# Patient Record
Sex: Male | Born: 1960 | Race: White | Hispanic: No | Marital: Married | Smoking: Never smoker
Health system: Southern US, Community
[De-identification: ages and names within clinical notes are randomized; demographics above are authoritative.]

## PROBLEM LIST (undated history)

## (undated) DIAGNOSIS — IMO0001 Reserved for inherently not codable concepts without codable children: Secondary | ICD-10-CM

## (undated) DIAGNOSIS — F419 Anxiety disorder, unspecified: Secondary | ICD-10-CM

## (undated) DIAGNOSIS — M1990 Unspecified osteoarthritis, unspecified site: Secondary | ICD-10-CM

## (undated) DIAGNOSIS — E785 Hyperlipidemia, unspecified: Secondary | ICD-10-CM

## (undated) DIAGNOSIS — I10 Essential (primary) hypertension: Secondary | ICD-10-CM

## (undated) DIAGNOSIS — G4700 Insomnia, unspecified: Secondary | ICD-10-CM

## (undated) HISTORY — PX: BACK SURGERY: SHX140

## (undated) HISTORY — PX: COLONOSCOPY W/ POLYPECTOMY: SHX1380

## (undated) HISTORY — PX: TYMPANOSTOMY TUBE PLACEMENT: SHX32

---

## 1999-09-16 ENCOUNTER — Ambulatory Visit (HOSPITAL_COMMUNITY): Admission: RE | Admit: 1999-09-16 | Discharge: 1999-09-16 | Payer: Self-pay | Admitting: Family Medicine

## 2001-02-01 ENCOUNTER — Encounter: Payer: Self-pay | Admitting: Family Medicine

## 2001-02-01 ENCOUNTER — Ambulatory Visit (HOSPITAL_COMMUNITY): Admission: RE | Admit: 2001-02-01 | Discharge: 2001-02-01 | Payer: Self-pay | Admitting: Family Medicine

## 2001-02-06 ENCOUNTER — Emergency Department (HOSPITAL_COMMUNITY): Admission: EM | Admit: 2001-02-06 | Discharge: 2001-02-06 | Payer: Self-pay

## 2001-02-13 ENCOUNTER — Encounter: Admission: RE | Admit: 2001-02-13 | Discharge: 2001-02-13 | Payer: Self-pay | Admitting: General Practice

## 2001-02-13 ENCOUNTER — Encounter: Payer: Self-pay | Admitting: General Practice

## 2005-07-17 ENCOUNTER — Emergency Department (HOSPITAL_COMMUNITY): Admission: EM | Admit: 2005-07-17 | Discharge: 2005-07-17 | Payer: Self-pay | Admitting: Emergency Medicine

## 2010-02-04 ENCOUNTER — Ambulatory Visit (HOSPITAL_COMMUNITY): Admission: RE | Admit: 2010-02-04 | Discharge: 2010-02-04 | Payer: Self-pay | Admitting: Surgery

## 2010-07-30 LAB — COMPREHENSIVE METABOLIC PANEL
AST: 28 U/L (ref 0–37)
Albumin: 4.6 g/dL (ref 3.5–5.2)
BUN: 15 mg/dL (ref 6–23)
Calcium: 9.8 mg/dL (ref 8.4–10.5)
Chloride: 103 mEq/L (ref 96–112)
Creatinine, Ser: 1.01 mg/dL (ref 0.4–1.5)
GFR calc Af Amer: >60 mL/min (ref >60)
GFR calc non Af Amer: >60 mL/min (ref >60)
Total Bilirubin: 0.3 mg/dL (ref 0.3–1.2)

## 2010-07-30 LAB — CBC
MCH: 30.4 pg (ref 26.0–34.0)
MCHC: 34.4 g/dL (ref 30.0–36.0)
MCV: 88.3 fL (ref 78.0–100.0)
Platelets: 206 10*3/uL (ref 150–400)
RDW: 12.8 % (ref 11.5–15.5)

## 2010-07-30 LAB — DIFFERENTIAL
Basophils Absolute: 0 10*3/uL (ref 0.0–0.1)
Eosinophils Relative: 2 % (ref 0–5)
Lymphocytes Relative: 30 % (ref 12–46)
Lymphs Abs: 2.3 10*3/uL (ref 0.7–4.0)
Monocytes Absolute: 0.7 10*3/uL (ref 0.1–1.0)
Neutro Abs: 4.6 10*3/uL (ref 1.7–7.7)

## 2010-07-30 LAB — SURGICAL PCR SCREEN: Staphylococcus aureus: NEGATIVE

## 2011-08-21 IMAGING — CR DG CHEST 2V
2 series · 2 of 2 positions shown · non-contrast
Comparison: None.

CLINICAL DATA: Umbilical hernia

CHEST - 2 VIEW

[w chest pa]
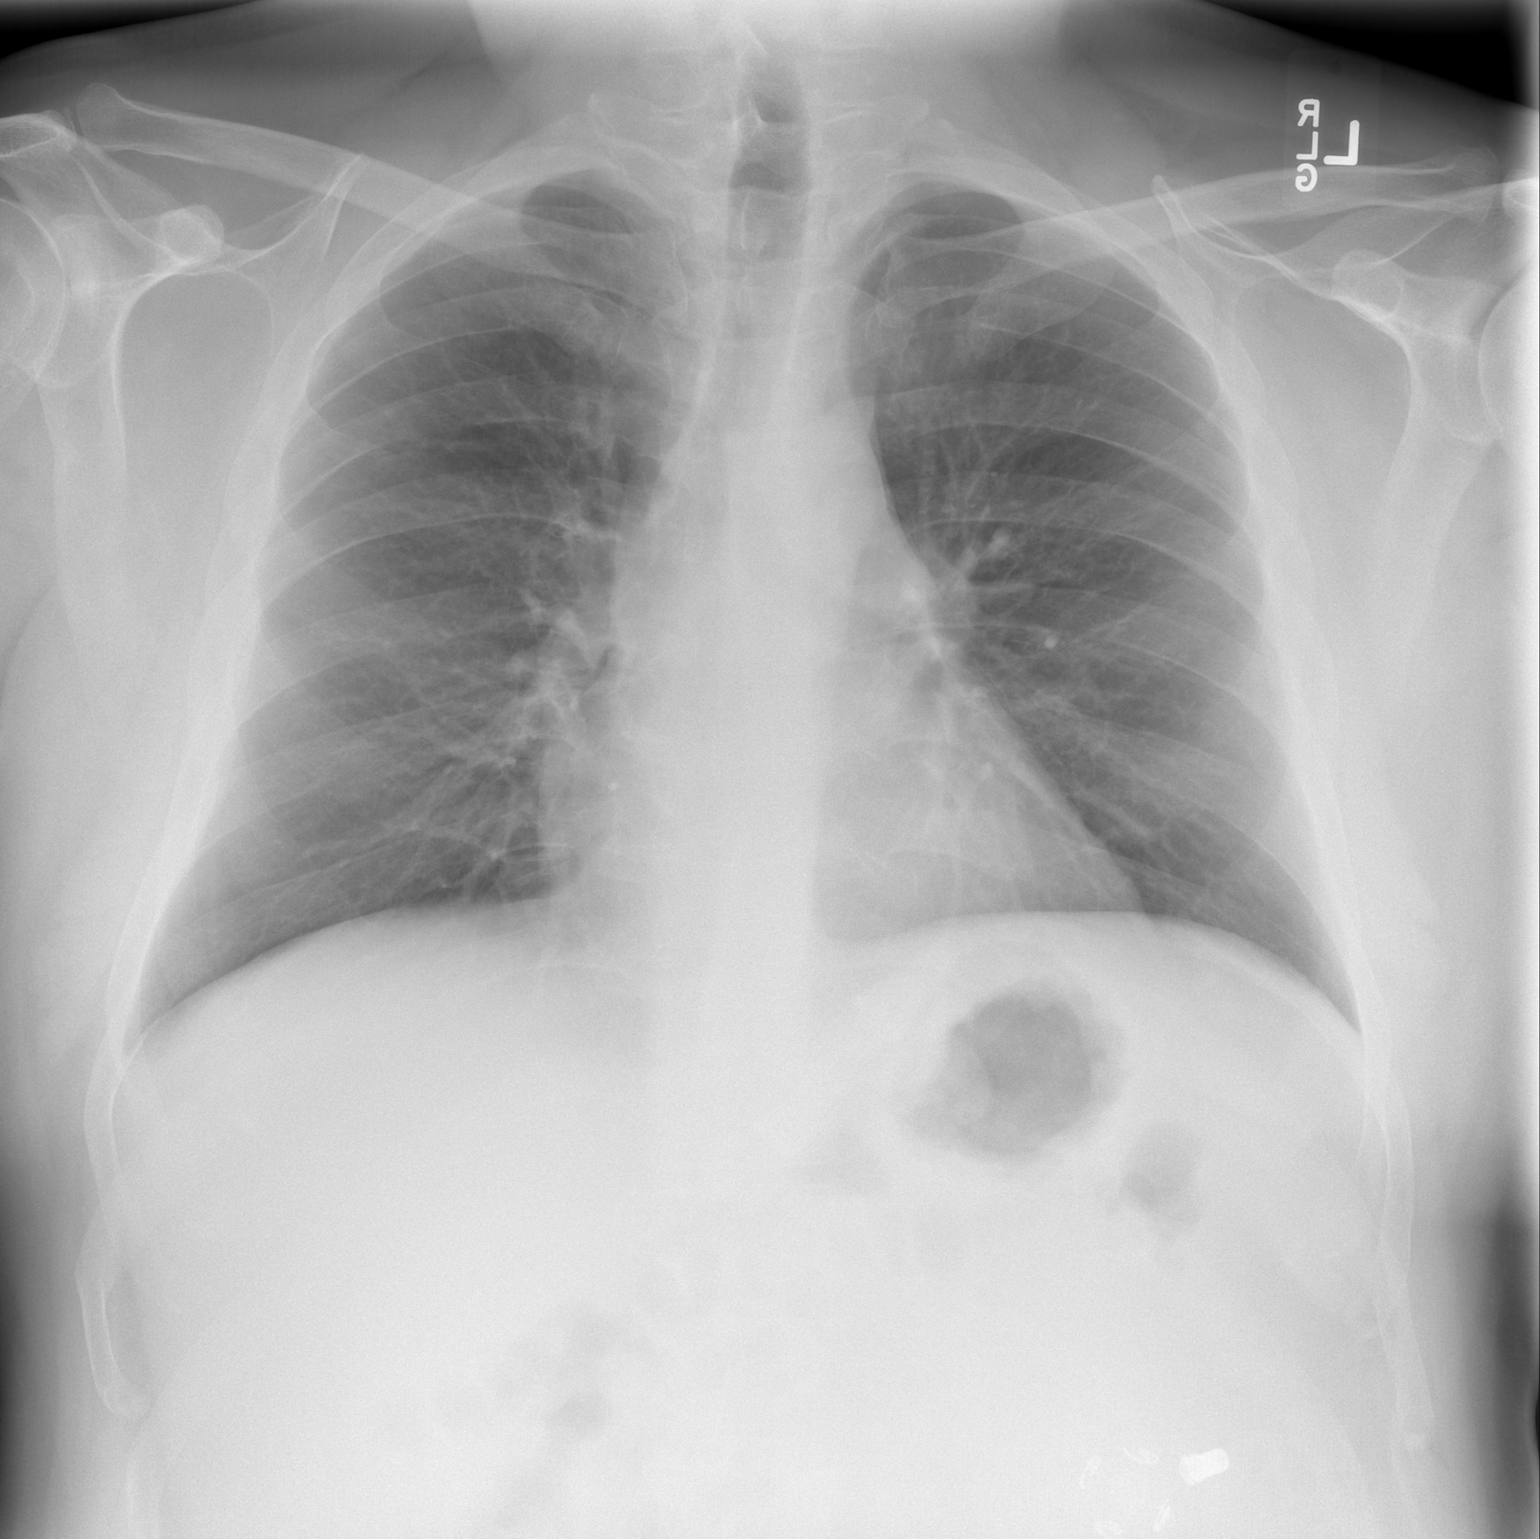

[w chest lat]
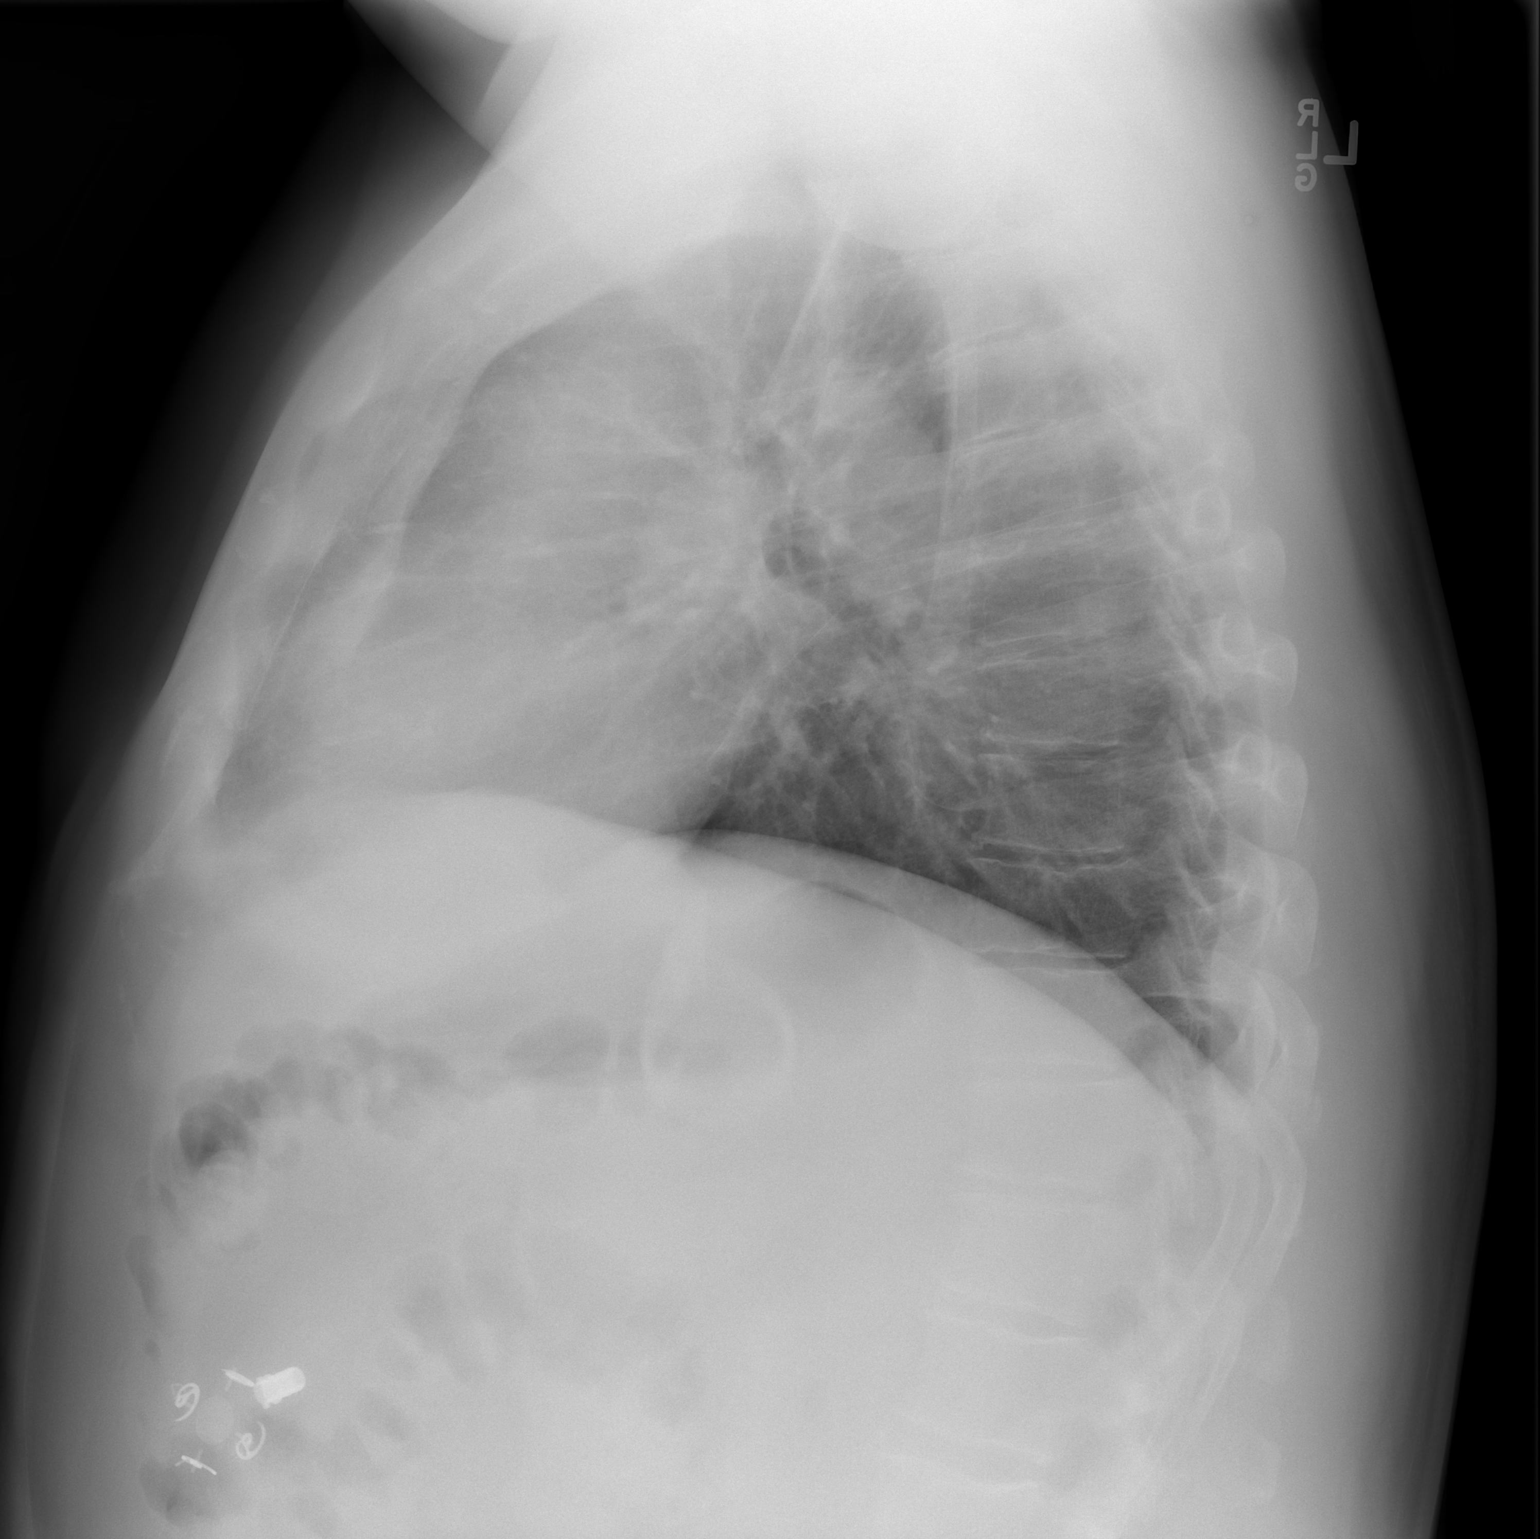

[2 of 2 positions shown; findings below may reference images not displayed]

FINDINGS: The lungs are clear bilaterally.  No confluent airspace
opacities, pleural effuions or pneumothoracies are seen.  The heart
is normal in size and contour.  The upper abdomen and osseous
structures are normal. A radiopaque foreign body projects in the
left upper abdomen of uncertain etiology.
IMPRESSION: No acute cardiopulmonary disease.  Radiopaque foreign body in the
left upper quadrant of the abdomen.  This could possibly be outside
the patient and correlation is recommended.

## 2013-05-08 ENCOUNTER — Ambulatory Visit
Admission: RE | Admit: 2013-05-08 | Discharge: 2013-05-08 | Disposition: A | Discharge status: Home / Self Care | Payer: BC Managed Care – PPO | Source: Ambulatory Visit | Attending: Physician Assistant | Admitting: Physician Assistant

## 2013-05-08 ENCOUNTER — Other Ambulatory Visit: Payer: Self-pay | Admitting: Physician Assistant

## 2013-05-08 DIAGNOSIS — M25511 Pain in right shoulder: Secondary | ICD-10-CM

## 2015-05-20 ENCOUNTER — Other Ambulatory Visit: Payer: Self-pay | Admitting: Physician Assistant

## 2015-05-20 DIAGNOSIS — G8929 Other chronic pain: Secondary | ICD-10-CM

## 2015-05-20 DIAGNOSIS — M25511 Pain in right shoulder: Principal | ICD-10-CM

## 2015-05-30 ENCOUNTER — Ambulatory Visit
Admission: RE | Admit: 2015-05-30 | Discharge: 2015-05-30 | Disposition: A | Discharge status: Home / Self Care | Payer: Managed Care, Other (non HMO) | Source: Ambulatory Visit | Attending: Physician Assistant | Admitting: Physician Assistant

## 2015-05-30 DIAGNOSIS — M25511 Pain in right shoulder: Principal | ICD-10-CM

## 2015-05-30 DIAGNOSIS — G8929 Other chronic pain: Secondary | ICD-10-CM

## 2015-07-02 NOTE — H&P (Signed)
  [name] is an 55 y.o. male.    Chief Complaint: right shoulder pain  HPI: Pt is a 55 y.o. male complaining of right shoulder pain for multiple years. Pain had continually increased since the beginning. X-rays in the clinic show end-stage arthritic changes of the right shoulder. Pt has tried various conservative treatments which have failed to alleviate their symptoms, including injections and therapy. Various options are discussed with the patient. Risks, benefits and expectations were discussed with the patient. Patient understand the risks, benefits and expectations and wishes to proceed with surgery.   PCP:  No primary care provider on file.  D/C Plans: Home  PMH: No past medical history on file.  PSH: No past surgical history on file.  Social History:  has no tobacco, alcohol, and drug history on file.  Allergies:  Allergies not on file  Medications: No current facility-administered medications for this encounter.   No current outpatient prescriptions on file.    No results found for this or any previous visit (from the past 48 hour(s)). No results found.  ROS: Pain with rom of the right upper extremity  Physical Exam:  Alert and oriented 55 y.o. male in no acute distress Cranial nerves 2-12 intact Cervical spine: full rom with no tenderness, nv intact distally Chest: active breath sounds bilaterally, no wheeze rhonchi or rales Heart: regular rate and rhythm, no murmur Abd: non tender non distended with active bowel sounds Hip is stable with rom  Right shoulder crepitus with rom Limited rom due to pain and guarding nv intact distally No rashes or edema Strength of ER and IR 4.5/5  Assessment/Plan Assessment: right shoulder end stage osteoarthritis  Plan: Patient will undergo a right total shoulder arthroplasty by Dr. Ranell Patrick at [facility]. Risks benefits and expectations were discussed with the patient. Patient understand risks, benefits and  expectations and wishes to proceed.

## 2015-07-15 ENCOUNTER — Encounter (HOSPITAL_COMMUNITY): Payer: Self-pay

## 2015-07-15 ENCOUNTER — Encounter (HOSPITAL_COMMUNITY)
Admission: RE | Admit: 2015-07-15 | Discharge: 2015-07-15 | Disposition: A | Discharge status: Home / Self Care | Payer: Managed Care, Other (non HMO) | Source: Ambulatory Visit | Attending: Orthopedic Surgery | Admitting: Orthopedic Surgery

## 2015-07-15 HISTORY — DX: Essential (primary) hypertension: I10

## 2015-07-15 HISTORY — DX: Hyperlipidemia, unspecified: E78.5

## 2015-07-15 HISTORY — DX: Anxiety disorder, unspecified: F41.9

## 2015-07-15 HISTORY — DX: Reserved for inherently not codable concepts without codable children: IMO0001

## 2015-07-15 HISTORY — DX: Insomnia, unspecified: G47.00

## 2015-07-15 HISTORY — DX: Unspecified osteoarthritis, unspecified site: M19.90

## 2015-07-15 LAB — BASIC METABOLIC PANEL
Anion gap: 13 (ref 5–15)
BUN: 18 mg/dL (ref 6–20)
CHLORIDE: 108 mmol/L (ref 101–111)
CO2: 24 mmol/L (ref 22–32)
Calcium: 9.7 mg/dL (ref 8.9–10.3)
Creatinine, Ser: 1.1 mg/dL (ref 0.61–1.24)
GFR calc non Af Amer: >60 mL/min (ref >60)
Glucose, Bld: 70 mg/dL (ref 65–99)
POTASSIUM: 4.3 mmol/L (ref 3.5–5.1)
SODIUM: 145 mmol/L (ref 135–145)

## 2015-07-15 LAB — CBC
HEMATOCRIT: 40.7 % (ref 39.0–52.0)
Hemoglobin: 13.3 g/dL (ref 13.0–17.0)
MCH: 30.4 pg (ref 26.0–34.0)
MCHC: 32.7 g/dL (ref 30.0–36.0)
MCV: 93.1 fL (ref 78.0–100.0)
Platelets: 208 10*3/uL (ref 150–400)
RBC: 4.37 MIL/uL (ref 4.22–5.81)
RDW: 12.5 % (ref 11.5–15.5)
WBC: 8.1 10*3/uL (ref 4.0–10.5)

## 2015-07-15 LAB — SURGICAL PCR SCREEN
MRSA, PCR: NEGATIVE
Staphylococcus aureus: NEGATIVE

## 2015-07-15 NOTE — Progress Notes (Signed)
[name] PCP is [name], [place] at [facility], [place].  Patient denies having a stress test, EKG or cardiac cath.  Last EKG, was prior to back surgery.  [name] denies chest pain or shortness of breath- except with exertion.

## 2015-07-15 NOTE — Progress Notes (Signed)
Cardiologist  Medical Md  Echo  Stress test  Heart cath

## 2015-07-15 NOTE — Pre-Procedure Instructions (Addendum)
[name]  07/15/2015     No Pharmacies Listed   Your procedure is scheduled on Fri, Mar 10 @ 1:10 PM  Report to [facility] Admitting at 11:10 AM  Call this number if you have problems the morning of surgery:  [phone]   Remember:  Do not eat food or drink liquids after midnight.  Take these medicines the morning of surgery with A SIP OF WATER Buspar(Buspirone),Prozac(Fluoxetine),and Flonase(Fluticasone).              May take Tylenol Arthritis, Benadryl if needed.             No Goody's,BC's,Aleve,Aspirin,Ibuprofen,Advil,Motrin,Fish Oil,or any Herbal Medications.    Do not wear jewelry.  Do not wear lotions, powders, or colognes.  You may wear deodorant.  Men may shave face and neck.  Do not bring valuables to the hospital.  [facility] is not responsible for any belongings or valuables.  Contacts, dentures or bridgework may not be worn into surgery.  Leave your suitcase in the car.  After surgery it may be brought to your room.  For patients admitted to the hospital, discharge time will be determined by your treatment team.  Patients discharged the day of surgery will not be allowed to drive home.    Special instructions:  [place] - Preparing for Surgery  Before surgery, you can play an important role.  Because skin is not sterile, your skin needs to be as free of germs as possible.  You can reduce the number of germs on you skin by washing with CHG (chlorahexidine gluconate) soap before surgery.  CHG is an antiseptic cleaner which kills germs and bonds with the skin to continue killing germs even after washing.  Please DO NOT use if you have an allergy to CHG or antibacterial soaps.  If your skin becomes reddened/irritated stop using the CHG and inform your nurse when you arrive at Short Stay.  Do not shave (including legs and underarms) for at least 48 hours prior to the first CHG shower.  You may shave your face.  Please follow these  instructions carefully:   1.  Shower with CHG Soap the night before surgery and the                                morning of Surgery.  2.  If you choose to wash your hair, wash your hair first as usual with your       normal shampoo.  3.  After you shampoo, rinse your hair and body thoroughly to remove the                      Shampoo.  4.  Use CHG as you would any other liquid soap.  You can apply chg directly       to the skin and wash gently with scrungie or a clean washcloth.  5.  Apply the CHG Soap to your body ONLY FROM THE NECK DOWN.        Do not use on open wounds or open sores.  Avoid contact with your eyes,       ears, mouth and genitals (private parts).  Wash genitals (private parts)       with your normal soap.  6.  Wash thoroughly, paying special attention to the area where your surgery will be performed.  7.  Thoroughly  rinse your body with warm water from the neck down.  8.  DO NOT shower/wash with your normal soap after using and rinsing off the CHG Soap.  9.  Pat yourself dry with a clean towel.            10.  Wear clean pajamas.            11.  Place clean sheets on your bed the night of your first shower and do not        sleep with pets.  Day of Surgery  Do not apply any lotions/deoderants the morning of surgery.  Please wear clean clothes to the hospital/surgery center.    Please read over the following fact sheets that you were given. Pain Booklet, Coughing and Deep Breathing, MRSA Information and Surgical Site Infection Prevention

## 2015-07-16 NOTE — Progress Notes (Addendum)
Anesthesia Chart Review: Patient is a 55 year old male scheduled for right total shoulder arthroplasty on 07/18/15.  History includes nonsmoker, hypertension, hyperlipidemia, insomnia, exertional dyspnea, anxiety, arthritis, back surgery. PCP is not listed, but he has several office visits with Georgette Shell, PA-C with [facility] (Care Everywhere).    Meds include Lipitor, BuSpar, Sominex, Prozac, Flonase, Zestoretic, Ambien CR, aspirin-caffeine.  PAT Vitals: BP 124/82 , HR 104 (76 bpm on EKG), RR 20, O2 100%, T 39.6C.  07/15/15 EKG:  Normal sinus rhythm, left axis deviation, early transition. The interpreting cardiologist did not feel that it was significantly changed since her last tracing in [place] (01/30/10). I think EKG also appears stable when compared to 09/20/12 tracing from [place].  07/03/15 CXR ([facility], Care Everywhere): FINDINGS: Lungs are clear. No interstitial or air space opacities. No pneumothorax or pleural effusion. Heart is not enlarged. Bones are normal for age. IMPRESSION: Normal chest radiograph for age. No pneumonia.  Preoperative labs noted.  If no acute changes then I anticipate that he can proceed as planned.  Velna Ochs [facility] Short Stay Center/Anesthesiology Phone [phone] 07/16/2015 6:49 PM

## 2015-07-17 MED ORDER — CEFAZOLIN SODIUM-DEXTROSE 2-3 GM-% IV SOLR
2.0000 g | INTRAVENOUS | Status: AC
  Administered 2015-07-18: 2 g via INTRAVENOUS
  Filled 2015-07-17: qty 50

## 2015-07-17 MED ORDER — CHLORHEXIDINE GLUCONATE 4 % EX LIQD: 60.0000 mL | Freq: Once | CUTANEOUS | Status: DC

## 2015-07-17 NOTE — Progress Notes (Signed)
Nurse instructed patient to arrive at 0830 instead of 1110. Patient verbalized understanding.

## 2015-07-18 ENCOUNTER — Inpatient Hospital Stay (HOSPITAL_COMMUNITY): Payer: Managed Care, Other (non HMO)

## 2015-07-18 ENCOUNTER — Encounter (HOSPITAL_COMMUNITY): Payer: Self-pay | Admitting: *Deleted

## 2015-07-18 ENCOUNTER — Inpatient Hospital Stay (HOSPITAL_COMMUNITY)
Admission: RE | Admit: 2015-07-18 | Discharge: 2015-07-20 | DRG: 483 | Disposition: A | Discharge status: Home / Self Care | Payer: Managed Care, Other (non HMO) | Source: Ambulatory Visit | Attending: Orthopedic Surgery | Admitting: Orthopedic Surgery

## 2015-07-18 ENCOUNTER — Inpatient Hospital Stay (HOSPITAL_COMMUNITY): Payer: Managed Care, Other (non HMO) | Admitting: Vascular Surgery

## 2015-07-18 ENCOUNTER — Inpatient Hospital Stay (HOSPITAL_COMMUNITY): Payer: Managed Care, Other (non HMO) | Admitting: Certified Registered Nurse Anesthetist

## 2015-07-18 ENCOUNTER — Encounter (HOSPITAL_COMMUNITY)
Admission: RE | Disposition: A | Discharge status: Home / Self Care | Payer: Self-pay | Source: Ambulatory Visit | Attending: Orthopedic Surgery

## 2015-07-18 DIAGNOSIS — Z96611 Presence of right artificial shoulder joint: Secondary | ICD-10-CM

## 2015-07-18 DIAGNOSIS — M25711 Osteophyte, right shoulder: Secondary | ICD-10-CM | POA: Diagnosis present

## 2015-07-18 DIAGNOSIS — Z96619 Presence of unspecified artificial shoulder joint: Secondary | ICD-10-CM

## 2015-07-18 DIAGNOSIS — E785 Hyperlipidemia, unspecified: Secondary | ICD-10-CM | POA: Diagnosis present

## 2015-07-18 DIAGNOSIS — I10 Essential (primary) hypertension: Secondary | ICD-10-CM | POA: Diagnosis present

## 2015-07-18 DIAGNOSIS — G4700 Insomnia, unspecified: Secondary | ICD-10-CM | POA: Diagnosis present

## 2015-07-18 DIAGNOSIS — M19011 Primary osteoarthritis, right shoulder: Principal | ICD-10-CM | POA: Diagnosis present

## 2015-07-18 DIAGNOSIS — F419 Anxiety disorder, unspecified: Secondary | ICD-10-CM | POA: Diagnosis present

## 2015-07-18 HISTORY — PX: TOTAL SHOULDER ARTHROPLASTY: SHX126

## 2015-07-18 SURGERY — ARTHROPLASTY, SHOULDER, TOTAL
Anesthesia: Regional | Site: Shoulder | Laterality: Right

## 2015-07-18 MED ORDER — BUPIVACAINE-EPINEPHRINE (PF) 0.25% -1:200000 IJ SOLN
INTRAMUSCULAR | Status: AC
  Filled 2015-07-18: qty 30

## 2015-07-18 MED ORDER — THROMBIN 5000 UNITS EX SOLR
CUTANEOUS | Status: AC
Start: 2015-07-18 — End: 2015-07-18
  Filled 2015-07-18: qty 5000

## 2015-07-18 MED ORDER — PROPOFOL 10 MG/ML IV BOLUS
INTRAVENOUS | Status: AC
  Filled 2015-07-18: qty 20

## 2015-07-18 MED ORDER — DOCUSATE SODIUM 100 MG PO CAPS
100.0000 mg | ORAL_CAPSULE | Freq: Two times a day (BID) | ORAL | Status: DC
  Administered 2015-07-18 – 2015-07-20 (×4): 100 mg via ORAL
  Filled 2015-07-18 (×4): qty 1

## 2015-07-18 MED ORDER — ACETAMINOPHEN 650 MG RE SUPP: 650.0000 mg | Freq: Four times a day (QID) | RECTAL | Status: DC | PRN

## 2015-07-18 MED ORDER — ROCURONIUM BROMIDE 50 MG/5ML IV SOLN
INTRAVENOUS | Status: AC
  Filled 2015-07-18: qty 1

## 2015-07-18 MED ORDER — BUPIVACAINE-EPINEPHRINE 0.25% -1:200000 IJ SOLN
INTRAMUSCULAR | Status: DC | PRN
  Administered 2015-07-18: 10 mL

## 2015-07-18 MED ORDER — SODIUM CHLORIDE 0.9 % IR SOLN
Status: DC | PRN
  Administered 2015-07-18: 1000 mL

## 2015-07-18 MED ORDER — HYDROMORPHONE HCL 1 MG/ML IJ SOLN
INTRAMUSCULAR | Status: AC
  Filled 2015-07-18: qty 1

## 2015-07-18 MED ORDER — LIDOCAINE HCL (CARDIAC) 20 MG/ML IV SOLN
INTRAVENOUS | Status: DC | PRN
  Administered 2015-07-18: 20 mg via INTRAVENOUS

## 2015-07-18 MED ORDER — DEXTROSE 5 % IV SOLN
10.0000 mg | INTRAVENOUS | Status: DC | PRN
  Administered 2015-07-18: 40 ug/min via INTRAVENOUS

## 2015-07-18 MED ORDER — FENTANYL CITRATE (PF) 100 MCG/2ML IJ SOLN
INTRAMUSCULAR | Status: AC
  Administered 2015-07-18: 50 ug
  Filled 2015-07-18: qty 2

## 2015-07-18 MED ORDER — ONDANSETRON HCL 4 MG/2ML IJ SOLN: 4.0000 mg | Freq: Four times a day (QID) | INTRAMUSCULAR | Status: DC | PRN

## 2015-07-18 MED ORDER — POLYETHYLENE GLYCOL 3350 17 G PO PACK
17.0000 g | PACK | Freq: Every day | ORAL | Status: DC | PRN
Start: 2015-07-18 — End: 2015-07-20

## 2015-07-18 MED ORDER — MIDAZOLAM HCL 2 MG/2ML IJ SOLN
INTRAMUSCULAR | Status: AC
  Administered 2015-07-18: 2 mg
  Filled 2015-07-18: qty 2

## 2015-07-18 MED ORDER — PHENOL 1.4 % MT LIQD: 1.0000 | OROMUCOSAL | Status: DC | PRN

## 2015-07-18 MED ORDER — BUSPIRONE HCL 15 MG PO TABS
7.5000 mg | ORAL_TABLET | Freq: Two times a day (BID) | ORAL | Status: DC | PRN
  Filled 2015-07-18: qty 1

## 2015-07-18 MED ORDER — ZOLPIDEM TARTRATE 5 MG PO TABS
5.0000 mg | ORAL_TABLET | Freq: Every evening | ORAL | Status: DC | PRN
Start: 2015-07-18 — End: 2015-07-20

## 2015-07-18 MED ORDER — ROCURONIUM BROMIDE 100 MG/10ML IV SOLN
INTRAVENOUS | Status: DC | PRN
  Administered 2015-07-18: 40 mg via INTRAVENOUS

## 2015-07-18 MED ORDER — CEFAZOLIN SODIUM-DEXTROSE 2-3 GM-% IV SOLR
2.0000 g | Freq: Four times a day (QID) | INTRAVENOUS | Status: AC
  Administered 2015-07-18 – 2015-07-19 (×3): 2 g via INTRAVENOUS
  Filled 2015-07-18 (×3): qty 50

## 2015-07-18 MED ORDER — HYDROCODONE-ACETAMINOPHEN 7.5-325 MG PO TABS: 1.0000 | ORAL_TABLET | Freq: Four times a day (QID) | ORAL | Status: DC | PRN

## 2015-07-18 MED ORDER — BUPIVACAINE-EPINEPHRINE (PF) 0.5% -1:200000 IJ SOLN
INTRAMUSCULAR | Status: DC | PRN
  Administered 2015-07-18: 30 mL via PERINEURAL

## 2015-07-18 MED ORDER — METHOCARBAMOL 500 MG PO TABS
ORAL_TABLET | ORAL | Status: AC
  Filled 2015-07-18: qty 1

## 2015-07-18 MED ORDER — FENTANYL CITRATE (PF) 100 MCG/2ML IJ SOLN
INTRAMUSCULAR | Status: DC | PRN
  Administered 2015-07-18: 250 ug via INTRAVENOUS

## 2015-07-18 MED ORDER — FLUOXETINE HCL 20 MG PO CAPS
20.0000 mg | ORAL_CAPSULE | Freq: Every day | ORAL | Status: DC
  Administered 2015-07-18: 20 mg via ORAL
  Filled 2015-07-18 (×3): qty 1

## 2015-07-18 MED ORDER — LISINOPRIL-HYDROCHLOROTHIAZIDE 10-12.5 MG PO TABS: 1.0000 | ORAL_TABLET | Freq: Every day | ORAL | Status: DC

## 2015-07-18 MED ORDER — PROMETHAZINE HCL 25 MG/ML IJ SOLN: 6.2500 mg | INTRAMUSCULAR | Status: DC | PRN

## 2015-07-18 MED ORDER — LIDOCAINE HCL (CARDIAC) 20 MG/ML IV SOLN
INTRAVENOUS | Status: AC
  Filled 2015-07-18: qty 5

## 2015-07-18 MED ORDER — ONDANSETRON HCL 4 MG/2ML IJ SOLN
INTRAMUSCULAR | Status: AC
  Filled 2015-07-18: qty 2

## 2015-07-18 MED ORDER — SODIUM CHLORIDE 0.9 % IV SOLN
INTRAVENOUS | Status: DC
  Administered 2015-07-19: 05:00:00 via INTRAVENOUS

## 2015-07-18 MED ORDER — ONDANSETRON HCL 4 MG PO TABS: 4.0000 mg | ORAL_TABLET | Freq: Four times a day (QID) | ORAL | Status: DC | PRN

## 2015-07-18 MED ORDER — ONDANSETRON HCL 4 MG/2ML IJ SOLN
INTRAMUSCULAR | Status: DC | PRN
  Administered 2015-07-18: 4 mg via INTRAVENOUS

## 2015-07-18 MED ORDER — HYDROCODONE-ACETAMINOPHEN 7.5-325 MG PO TABS
ORAL_TABLET | ORAL | Status: AC
  Filled 2015-07-18: qty 2

## 2015-07-18 MED ORDER — MIDAZOLAM HCL 2 MG/2ML IJ SOLN: 0.5000 mg | Freq: Once | INTRAMUSCULAR | Status: DC | PRN

## 2015-07-18 MED ORDER — ATORVASTATIN CALCIUM 10 MG PO TABS
10.0000 mg | ORAL_TABLET | Freq: Every day | ORAL | Status: DC
  Administered 2015-07-18 – 2015-07-19 (×2): 10 mg via ORAL
  Filled 2015-07-18 (×2): qty 1

## 2015-07-18 MED ORDER — THROMBIN 5000 UNITS EX SOLR
CUTANEOUS | Status: DC | PRN
  Administered 2015-07-18: 5000 [IU] via TOPICAL

## 2015-07-18 MED ORDER — METOCLOPRAMIDE HCL 5 MG PO TABS: 5.0000 mg | ORAL_TABLET | Freq: Three times a day (TID) | ORAL | Status: DC | PRN

## 2015-07-18 MED ORDER — ASPIRIN-SALICYLAMIDE-CAFFEINE 742-222-38 MG PO PACK: PACK | Freq: Every day | ORAL | Status: DC | PRN

## 2015-07-18 MED ORDER — LISINOPRIL 10 MG PO TABS
10.0000 mg | ORAL_TABLET | Freq: Every day | ORAL | Status: DC
  Administered 2015-07-19 – 2015-07-20 (×2): 10 mg via ORAL
  Filled 2015-07-18: qty 1

## 2015-07-18 MED ORDER — ACETAMINOPHEN 325 MG PO TABS
650.0000 mg | ORAL_TABLET | Freq: Four times a day (QID) | ORAL | Status: DC | PRN
  Administered 2015-07-19 – 2015-07-20 (×2): 650 mg via ORAL
  Filled 2015-07-18 (×2): qty 2

## 2015-07-18 MED ORDER — FLUTICASONE PROPIONATE 50 MCG/ACT NA SUSP
1.0000 | Freq: Every day | NASAL | Status: DC | PRN
  Filled 2015-07-18: qty 16

## 2015-07-18 MED ORDER — MIDAZOLAM HCL 2 MG/2ML IJ SOLN
INTRAMUSCULAR | Status: AC
  Filled 2015-07-18: qty 2

## 2015-07-18 MED ORDER — LACTATED RINGERS IV SOLN
INTRAVENOUS | Status: DC
  Administered 2015-07-18: 09:00:00 via INTRAVENOUS

## 2015-07-18 MED ORDER — MIDAZOLAM HCL 5 MG/5ML IJ SOLN
INTRAMUSCULAR | Status: DC | PRN
  Administered 2015-07-18: 2 mg via INTRAVENOUS

## 2015-07-18 MED ORDER — MEPERIDINE HCL 25 MG/ML IJ SOLN: 6.2500 mg | INTRAMUSCULAR | Status: DC | PRN

## 2015-07-18 MED ORDER — HYDROCHLOROTHIAZIDE 12.5 MG PO CAPS
12.5000 mg | ORAL_CAPSULE | Freq: Every day | ORAL | Status: DC
  Administered 2015-07-19 – 2015-07-20 (×2): 12.5 mg via ORAL
  Filled 2015-07-18 (×2): qty 1

## 2015-07-18 MED ORDER — HYDROMORPHONE HCL 1 MG/ML IJ SOLN
0.2500 mg | INTRAMUSCULAR | Status: DC | PRN
  Administered 2015-07-18 (×4): 0.5 mg via INTRAVENOUS

## 2015-07-18 MED ORDER — HYDROCODONE-ACETAMINOPHEN 7.5-325 MG PO TABS
1.0000 | ORAL_TABLET | ORAL | Status: DC | PRN
  Administered 2015-07-18 – 2015-07-19 (×5): 2 via ORAL
  Filled 2015-07-18 (×5): qty 2

## 2015-07-18 MED ORDER — FENTANYL CITRATE (PF) 250 MCG/5ML IJ SOLN
INTRAMUSCULAR | Status: AC
  Filled 2015-07-18: qty 5

## 2015-07-18 MED ORDER — ACETAMINOPHEN ER 650 MG PO TBCR: 1300.0000 mg | EXTENDED_RELEASE_TABLET | Freq: Every day | ORAL | Status: DC | PRN

## 2015-07-18 MED ORDER — METOCLOPRAMIDE HCL 5 MG/ML IJ SOLN: 5.0000 mg | Freq: Three times a day (TID) | INTRAMUSCULAR | Status: DC | PRN

## 2015-07-18 MED ORDER — METHOCARBAMOL 1000 MG/10ML IJ SOLN
500.0000 mg | Freq: Four times a day (QID) | INTRAVENOUS | Status: DC | PRN
  Filled 2015-07-18: qty 5

## 2015-07-18 MED ORDER — METHOCARBAMOL 500 MG PO TABS: 500.0000 mg | ORAL_TABLET | Freq: Three times a day (TID) | ORAL | Status: AC | PRN

## 2015-07-18 MED ORDER — MORPHINE SULFATE (PF) 2 MG/ML IV SOLN
2.0000 mg | INTRAVENOUS | Status: DC | PRN
  Administered 2015-07-18 (×2): 2 mg via INTRAVENOUS
  Administered 2015-07-19 – 2015-07-20 (×9): 4 mg via INTRAVENOUS
  Filled 2015-07-18: qty 2
  Filled 2015-07-18: qty 1
  Filled 2015-07-18: qty 2
  Filled 2015-07-18: qty 1
  Filled 2015-07-18 (×7): qty 2

## 2015-07-18 MED ORDER — PROPOFOL 10 MG/ML IV BOLUS
INTRAVENOUS | Status: DC | PRN
  Administered 2015-07-18: 50 mg via INTRAVENOUS
  Administered 2015-07-18: 150 mg via INTRAVENOUS

## 2015-07-18 MED ORDER — LACTATED RINGERS IV SOLN
INTRAVENOUS | Status: DC | PRN
  Administered 2015-07-18 (×2): via INTRAVENOUS

## 2015-07-18 MED ORDER — MENTHOL 3 MG MT LOZG: 1.0000 | LOZENGE | OROMUCOSAL | Status: DC | PRN

## 2015-07-18 MED ORDER — PHENYLEPHRINE 40 MCG/ML (10ML) SYRINGE FOR IV PUSH (FOR BLOOD PRESSURE SUPPORT)
PREFILLED_SYRINGE | INTRAVENOUS | Status: AC
  Filled 2015-07-18: qty 10

## 2015-07-18 MED ORDER — METHOCARBAMOL 500 MG PO TABS
500.0000 mg | ORAL_TABLET | Freq: Four times a day (QID) | ORAL | Status: DC | PRN
  Administered 2015-07-18 – 2015-07-20 (×6): 500 mg via ORAL
  Filled 2015-07-18 (×6): qty 1

## 2015-07-18 SURGICAL SUPPLY — 77 items
BLADE SAW SAG 73X25 THK (BLADE) ×2
BLADE SAW SGTL 73X25 THK (BLADE) ×1 IMPLANT
BUR SURG 4X8 MED (BURR) ×1 IMPLANT
BURR SURG 4MMX8MM MEDIUM (BURR) ×1
BURR SURG 4X8 MED (BURR) ×2
CAPT SHLDR TOTAL 2 ×3 IMPLANT
CEMENT BONE DEPUY (Cement) ×3 IMPLANT
CLOSURE STERI-STRIP 1/2X4 (GAUZE/BANDAGES/DRESSINGS) ×1
CLOSURE WOUND 1/2 X4 (GAUZE/BANDAGES/DRESSINGS)
CLSR STERI-STRIP ANTIMIC 1/2X4 (GAUZE/BANDAGES/DRESSINGS) ×2 IMPLANT
COVER SURGICAL LIGHT HANDLE (MISCELLANEOUS) ×3 IMPLANT
DRAPE IMP U-DRAPE 54X76 (DRAPES) ×3 IMPLANT
DRAPE INCISE IOBAN 66X45 STRL (DRAPES) ×9 IMPLANT
DRAPE U-SHAPE 47X51 STRL (DRAPES) ×3 IMPLANT
DRAPE X-RAY CASS 24X20 (DRAPES) IMPLANT
DRILL BIT 5/64 (BIT) ×3 IMPLANT
DRSG ADAPTIC 3X8 NADH LF (GAUZE/BANDAGES/DRESSINGS) ×3 IMPLANT
DRSG PAD ABDOMINAL 8X10 ST (GAUZE/BANDAGES/DRESSINGS) ×3 IMPLANT
DURAPREP 26ML APPLICATOR (WOUND CARE) ×3 IMPLANT
ELECT BLADE 4.0 EZ CLEAN MEGAD (MISCELLANEOUS) ×3
ELECT NEEDLE TIP 2.8 STRL (NEEDLE) ×3 IMPLANT
ELECT REM PT RETURN 9FT ADLT (ELECTROSURGICAL) ×3
ELECTRODE BLDE 4.0 EZ CLN MEGD (MISCELLANEOUS) ×1 IMPLANT
ELECTRODE REM PT RTRN 9FT ADLT (ELECTROSURGICAL) ×1 IMPLANT
GAUZE SPONGE 4X4 12PLY STRL (GAUZE/BANDAGES/DRESSINGS) IMPLANT
GLOVE BIOGEL PI ORTHO PRO 7.5 (GLOVE) ×2
GLOVE BIOGEL PI ORTHO PRO SZ8 (GLOVE) ×2
GLOVE ORTHO TXT STRL SZ7.5 (GLOVE) ×3 IMPLANT
GLOVE PI ORTHO PRO STRL 7.5 (GLOVE) ×1 IMPLANT
GLOVE PI ORTHO PRO STRL SZ8 (GLOVE) ×1 IMPLANT
GLOVE SURG ORTHO 8.5 STRL (GLOVE) ×6 IMPLANT
GOWN STRL REUS W/ TWL LRG LVL3 (GOWN DISPOSABLE) ×1 IMPLANT
GOWN STRL REUS W/ TWL XL LVL3 (GOWN DISPOSABLE) ×3 IMPLANT
GOWN STRL REUS W/TWL LRG LVL3 (GOWN DISPOSABLE) ×2
GOWN STRL REUS W/TWL XL LVL3 (GOWN DISPOSABLE) ×6
HANDPIECE INTERPULSE COAX TIP (DISPOSABLE)
KIT BASIN OR (CUSTOM PROCEDURE TRAY) ×3 IMPLANT
KIT ROOM TURNOVER OR (KITS) ×3 IMPLANT
MANIFOLD NEPTUNE II (INSTRUMENTS) ×3 IMPLANT
NDL SUT 6 .5 CRC .975X.05 MAYO (NEEDLE) ×1 IMPLANT
NEEDLE 1/2 CIR MAYO (NEEDLE) ×3 IMPLANT
NEEDLE HYPO 25GX1X1/2 BEV (NEEDLE) ×3 IMPLANT
NEEDLE MAYO TAPER (NEEDLE) ×2
NS IRRIG 1000ML POUR BTL (IV SOLUTION) ×3 IMPLANT
PACK SHOULDER (CUSTOM PROCEDURE TRAY) ×3 IMPLANT
PACK UNIVERSAL I (CUSTOM PROCEDURE TRAY) ×3 IMPLANT
PAD ARMBOARD 7.5X6 YLW CONV (MISCELLANEOUS) ×6 IMPLANT
PIN METAGLENE 2.5 (PIN) IMPLANT
SET HNDPC FAN SPRY TIP SCT (DISPOSABLE) IMPLANT
SLING ARM IMMOBILIZER LRG (SOFTGOODS) ×3 IMPLANT
SLING ARM IMMOBILIZER MED (SOFTGOODS) IMPLANT
SMARTMIX MINI TOWER (MISCELLANEOUS) ×3
SPONGE GAUZE 4X4 12PLY STER LF (GAUZE/BANDAGES/DRESSINGS) ×3 IMPLANT
SPONGE LAP 18X18 X RAY DECT (DISPOSABLE) ×3 IMPLANT
SPONGE LAP 4X18 X RAY DECT (DISPOSABLE) ×3 IMPLANT
SPONGE SURGIFOAM ABS GEL SZ50 (HEMOSTASIS) ×3 IMPLANT
STRIP CLOSURE SKIN 1/2X4 (GAUZE/BANDAGES/DRESSINGS) IMPLANT
SUCTION FRAZIER HANDLE 10FR (MISCELLANEOUS) ×2
SUCTION TUBE FRAZIER 10FR DISP (MISCELLANEOUS) ×1 IMPLANT
SUT FIBERWIRE #2 38 T-5 BLUE (SUTURE) ×12
SUT MNCRL AB 4-0 PS2 18 (SUTURE) ×3 IMPLANT
SUT VIC AB 0 CT1 27 (SUTURE) ×2
SUT VIC AB 0 CT1 27XBRD ANBCTR (SUTURE) ×1 IMPLANT
SUT VIC AB 0 CT2 27 (SUTURE) ×3 IMPLANT
SUT VIC AB 1 CT1 27 (SUTURE) ×2
SUT VIC AB 1 CT1 27XBRD ANBCTR (SUTURE) ×1 IMPLANT
SUT VIC AB 2-0 CT1 27 (SUTURE) ×2
SUT VIC AB 2-0 CT1 TAPERPNT 27 (SUTURE) ×1 IMPLANT
SUT VICRYL AB 2 0 TIES (SUTURE) IMPLANT
SUTURE FIBERWR #2 38 T-5 BLUE (SUTURE) ×4 IMPLANT
SYR CONTROL 10ML LL (SYRINGE) ×3 IMPLANT
TOWEL OR 17X24 6PK STRL BLUE (TOWEL DISPOSABLE) ×3 IMPLANT
TOWEL OR 17X26 10 PK STRL BLUE (TOWEL DISPOSABLE) ×3 IMPLANT
TOWER SMARTMIX MINI (MISCELLANEOUS) ×1 IMPLANT
TRAY FOLEY CATH 16FRSI W/METER (SET/KITS/TRAYS/PACK) IMPLANT
WATER STERILE IRR 1000ML POUR (IV SOLUTION) ×3 IMPLANT
YANKAUER SUCT BULB TIP NO VENT (SUCTIONS) IMPLANT

## 2015-07-18 NOTE — Anesthesia Procedure Notes (Addendum)
Anesthesia Regional Block:  Interscalene brachial plexus block  Pre-Anesthetic Checklist: ,, timeout performed, Correct Patient, Correct Site, Correct Laterality, Correct Procedure, Correct Position, site marked, Risks and benefits discussed,  Surgical consent,  Pre-op evaluation,  At surgeon's request and post-op pain management  Laterality: Right and Upper  Prep: chloraprep       Needles:  Injection technique: Single-shot  Needle Type: Echogenic Stimulator Needle     Needle Length: 5cm 5 cm Needle Gauge: 22 and 22 G    Additional Needles:  Procedures: nerve stimulator Interscalene brachial plexus block  Nerve Stimulator or Paresthesia:  Response: forearm twitch, 0.42 mA, 0.1 ms,   Additional Responses:   Narrative:  Start time: 07/18/2015 9:21 AM End time: 07/18/2015 9:28 AM Injection made incrementally with aspirations every 5 mL.  Performed by: Personally  Anesthesiologist: Jean RosenthalJACKSON, CARSWELL  Additional Notes: Pt identified in Holding room.  Monitors applied. Working IV access confirmed. Sterile prep R neck.  #22ga PNS to forearm twitch at 0.4242mA threshold.  30cc 0.5% Bupivacaine with 1:200k epi injected incrementally after negative test dose.  Patient asymptomatic, VSS, no heme aspirated, tolerated well.  Sandford Craze Jackson, MD   Procedure Name: Intubation Date/Time: 07/18/2015 10:16 AM Performed by: Rogelia BogaMUELLER, Ajia Chadderdon P Pre-anesthesia Checklist: Patient identified, Emergency Drugs available, Suction available, Patient being monitored and Timeout performed Patient Re-evaluated:Patient Re-evaluated prior to inductionOxygen Delivery Method: Circle system utilized Preoxygenation: Pre-oxygenation with 100% oxygen Intubation Type: IV induction Ventilation: Mask ventilation without difficulty Laryngoscope Size: Mac and 4 Grade View: Grade II Tube type: Oral Tube size: 7.5 mm Number of attempts: 1 Airway Equipment and Method: Stylet Placement Confirmation: ETT inserted through vocal  cords under direct vision,  positive ETCO2 and breath sounds checked- equal and bilateral Secured at: 23 cm Tube secured with: Tape Dental Injury: Teeth and Oropharynx as per pre-operative assessment

## 2015-07-18 NOTE — Anesthesia Preprocedure Evaluation (Addendum)
Anesthesia Evaluation  Patient identified by MRN, date of birth, ID band Patient awake    Reviewed: Allergy & Precautions, NPO status , Patient's Chart, lab work & pertinent test results  History of Anesthesia Complications Negative for: history of anesthetic complications  Airway Mallampati: II  TM Distance: >3 FB Neck ROM: Full    Dental  (+) Teeth Intact, Dental Advisory Given   Pulmonary shortness of breath and with exertion,    breath sounds clear to auscultation       Cardiovascular hypertension, Pt. on medications (-) angina Rhythm:Regular Rate:Normal     Neuro/Psych Back pain    GI/Hepatic negative GI ROS, Neg liver ROS,   Endo/Other  negative endocrine ROS  Renal/GU negative Renal ROS     Musculoskeletal  (+) Arthritis , Osteoarthritis,    Abdominal   Peds  Hematology negative hematology ROS (+)   Anesthesia Other Findings   Reproductive/Obstetrics                           Anesthesia Physical Anesthesia Plan  ASA: II  Anesthesia Plan: General and Regional   Post-op Pain Management: GA combined w/ Regional for post-op pain   Induction: Intravenous  Airway Management Planned: Oral ETT  Additional Equipment:   Intra-op Plan:   Post-operative Plan: Extubation in OR  Informed Consent: I have reviewed the patients History and Physical, chart, labs and discussed the procedure including the risks, benefits and alternatives for the proposed anesthesia with the patient or authorized representative who has indicated his/her understanding and acceptance.   Dental advisory given  Plan Discussed with: CRNA, Anesthesiologist and Surgeon  Anesthesia Plan Comments: (Plan routine monitors, GETA with interscalene block for post op analgesia)       Anesthesia Quick Evaluation

## 2015-07-18 NOTE — Interval H&P Note (Signed)
History and Physical Interval Note:  07/18/2015 9:46 AM  [name]  has presented today for surgery, with the diagnosis of right shoulder end stage osteoarthritis  The various methods of treatment have been discussed with the patient and family. After consideration of risks, benefits and other options for treatment, the patient has consented to  Procedure(s): RIGHT TOTAL SHOULDER ARTHROPLASTY (Right) as a surgical intervention .  The patient's history has been reviewed, patient examined, no change in status, stable for surgery.  I have reviewed the patient's chart and labs.  Questions were answered to the patient's satisfaction.     Pecola Haxton,Mohmmad R

## 2015-07-18 NOTE — Transfer of Care (Signed)
Immediate Anesthesia Transfer of Care Note  Patient: [name]  Procedure(s) Performed: Procedure(s): RIGHT TOTAL SHOULDER ARTHROPLASTY (Right)  Patient Location: PACU  Anesthesia Type:General and GA combined with regional for post-op pain  Level of Consciousness: awake, alert , oriented and patient cooperative  Airway & Oxygen Therapy: Patient Spontanous Breathing and Patient connected to nasal cannula oxygen  Post-op Assessment: Report given to RN, Post -op Vital signs reviewed and stable and Patient moving all extremities X 4  Post vital signs: Reviewed and stable  Last Vitals:  Filed Vitals:   07/18/15 0940 07/18/15 1303  BP: 134/74 124/82  Pulse: 83 74  Temp:  36.6 C  Resp: 22 20    Complications: No apparent anesthesia complications

## 2015-07-18 NOTE — Discharge Instructions (Signed)
Ice to the shoulder constantly.  Use sling while up and walking around or out of the home.  Ok to remove the sling in the home while seated.  Hug a pillow to rest the arm.  Keep elbow propped forward where you can "see it"  Start the exercises as soon as possible.  Lap Slides, Hug and Hitch hike rotation exercise,  Assisted forward elevation to 90 degrees,  Pendulums  Keep the incision clean and dry and covered for one week, then ok to get wet in the shower.  No forceful use of the right shoulder.  Ok to use for light ADLs.  Call for follow up appointment in two weeks [phone]

## 2015-07-18 NOTE — Brief Op Note (Signed)
07/18/2015  1:09 PM  PATIENT:  [name]  55 y.o. male  PRE-OPERATIVE DIAGNOSIS:  right shoulder end stage osteoarthritis  POST-OPERATIVE DIAGNOSIS:  right shoulder end stage osteoarthritis  PROCEDURE:  Procedure(s): RIGHT TOTAL SHOULDER ARTHROPLASTY (Right) DePuy Global Unite, APG Glenoid  SURGEON:  Surgeon(s) and Role:    * Beverely LowSteve Maryjayne Kleven, MD - Primary  PHYSICIAN ASSISTANT:   ASSISTANTS: Thea Gisthomas B Dixon, PA-C   ANESTHESIA:   regional and general  EBL:  Total I/O In: 1100 [I.V.:1100] Out: 200 [Blood:200]  BLOOD ADMINISTERED:none  DRAINS: none   LOCAL MEDICATIONS USED:  MARCAINE     SPECIMEN:  No Specimen  DISPOSITION OF SPECIMEN:  N/A  COUNTS:  YES  TOURNIQUET:  * No tourniquets in log *  DICTATION: .Other Dictation: Dictation Number [id number]  PLAN OF CARE: Admit to inpatient   PATIENT DISPOSITION:  PACU - hemodynamically stable.   Delay start of Pharmacological VTE agent (>24hrs) due to surgical blood loss or risk of bleeding: not applicable

## 2015-07-18 NOTE — Op Note (Signed)
[name], [name]               ACCOUNT NO.:  [id number]  MEDICAL RECORD NO.:  [id number]  LOCATION:  5N31C                        FACILITY:  [facility]  PHYSICIAN:  Almedia BallsSteven R. Ranell PatrickNorris, M.D. DATE OF BIRTH:  02-21-1961  DATE OF PROCEDURE:  07/18/2015 DATE OF DISCHARGE:                              OPERATIVE REPORT   PREOPERATIVE DIAGNOSIS:  Right shoulder end-stage osteoarthritis.  POSTOPERATIVE DIAGNOSIS:  Right shoulder end-stage osteoarthritis.  PROCEDURE PERFORMED:  Right total shoulder arthroplasty using DePuy Global Unite System of APG glenoid.  ATTENDING SURGEON:  Almedia BallsSteven R. Ranell PatrickNorris, M.D.  ASSISTANT:  Standley Dakinshomas Dixon, PA-C, who scrubbed the entire procedure and was necessary for satisfactory completion of the surgery.  ANESTHESIA:  General anesthesia was used plus interscalene block.  ESTIMATED BLOOD LOSS:  200 mL.  FLUID REPLACED:  1500 mL crystalloid.  INSTRUMENT COUNTS:  Correct.  COMPLICATIONS:  There were no complications.  ANTIBIOTICS:  Perioperative antibiotics were given.  INDICATIONS:  The patient is a [age] year old male, with worsening end- stage arthritis of the right shoulder.  The patient has had progressive pain, despite conservative management.  The patient has severe doubled dip deformity in his glenoid consistent with posterior glenoid wear and consistent with primary osteoarthritis.  The patient has failed all measures of conservative management, desires operative treatment to restore function, relieve pain.  Informed consent was obtained.  DESCRIPTION OF PROCEDURE:  After an adequate level of anesthesia was achieved, the patient was positioned in a modified beach-chair position. Right shoulder correctly identified and sterilely prepped and draped in usual manner.  Time-out called.  We entered the patient's shoulder using standard deltopectoral incision, started the coracoid process, extending down the anterior humerus.  We identified the cephalic vein,  taken laterally with deltoid pectoralis taken medially.  Conjoint tendon identified retracted medially.  Subscapularis was released subperiosteally off the lesser tuberosity and tagged for repair at the end with #2 FiberWire suture.  We then released the inferior capsule off the neck.  There was severe bone-on-bone arthritis with large inferior osteophytes.  We then went ahead and placed a T-handled Crego over the top of the humeral head, adjacent to the rotator cuff insertion site and a large Crego elevator medially.  We then used a head resection guide to mark the humeral head resection and we did this with the elbow at the patient's side and about 15-20 degrees of retroversion.  The patient did have significant posterior glenoid erosion.  We were going to be correcting that version some, but I did not want to overly retrovert the cut on the humeral side.  At this point, we made our head cut, with the oscillating saw.  We removed excess osteophytes inferiorly and posteriorly with a rongeur and an osteotome.  We then irrigated.  We next went ahead and finished preparing the humeral shaft with sequential reaming started at 6 and reaming up to size 14.  We then punched with a 12 and 14 punch in appropriate version, looked like estimated between 15 degrees and 20 degrees of retroversion.  Once that was done, we removed the trial components, subluxed the humerus posteriorly.  Freed up the subscapularis for repair at the end.  Removed  the capsule, which was hypertrophied and inflamed, with care taken towards protecting the axillary nerve.  Once we had 360 degree exposure of the glenoid face, we removed a large posterior inferior osteophyte with an osteotome and drill bits to control the osteotomy and we were able to remove that large spur and get back to the native glenoid.  We found the center point of that, drilled a guide pin, reamed initially with a low-profile reamer then the larger  reamer 48 glenoid APG glenoid.  We then drilled our central peg hole and the peripheral holes for the guide, the whole guide set reference in terms of rotationally off the inferior scapular body.  Once those holes were drilled, we placed our trial 48 APG glenoid in place, it sat perfectly flush.  We then removed the trial components, irrigated and then placed epi soaked Gel-Foam soaked in thrombin around the 3 peripheral holes to dry that.  We vacuum excess cement, and then using a cement gun, was able to pressurize and place cement in the 3 peripheral holes.  This is a DePuy 1 cement, then we impacted the real APG glenoid in place with that fluted PEG not having any cement on it and we held the APG glenoid in place until the cement hardened on the table, approximately 17-18 minutes.  Once that was hard, we checked the stability of the glenoid component, which it was quite stable.  We then went ahead and irrigated thoroughly.  We placed multiple drill holes in the lesser tuberosity #2 FiberWire suture for repair of the subscapularis to bone.  We then used impaction grafting technique to place the press-fit 14, body 14 Global Unite stem in place with appropriate version about 18 degrees or so.  With that impacted in place.  We trailed with a 48 x 18 humeral head component.  We then went with a 21, which provided better stability and a coverage is perfect for the bone exposed there at the proximal humerus, so once we had taken the trial out and selected real 48 x 21 glenoid, and it was eccentric; we doubt that eccentricity superiorly and posteriorly with excellent coverage, bony coverage.  To reduce the shoulder had perfect soft tissue balancing.  We were able displace about 50% inferiorly, 50% posteriorly. We irrigated thoroughly and then did an anatomic subscapularis repair back to the lesser tuberosity, back to bone.  We also repaired the rotator interval.  We were able to get about 45  degrees of external rotation, with repair and no tethering as we ranged that shoulder through a full arc of motion.  Final irrigation and closed deltopectoral interval with interrupted #1 Vicryl suture followed by 0 Vicryl, 2-0 Vicryl, layered subcutaneous closure and 4-0 Monocryl for skin.  Steri- Strips applied followed by sterile dressing.  The patient tolerated the surgery well.     Almedia Balls. Ranell Patrick, M.D.     SRN/MEDQ  D:  07/18/2015  T:  07/18/2015  Job:  [id number]

## 2015-07-19 LAB — BASIC METABOLIC PANEL
Anion gap: 7 (ref 5–15)
BUN: 15 mg/dL (ref 6–20)
CHLORIDE: 103 mmol/L (ref 101–111)
CO2: 25 mmol/L (ref 22–32)
Calcium: 8.7 mg/dL — ABNORMAL LOW (ref 8.9–10.3)
Creatinine, Ser: 0.72 mg/dL (ref 0.61–1.24)
GFR calc Af Amer: >60 mL/min (ref >60)
GLUCOSE: 117 mg/dL — AB (ref 65–99)
POTASSIUM: 3.8 mmol/L (ref 3.5–5.1)
Sodium: 135 mmol/L (ref 135–145)

## 2015-07-19 LAB — HEMOGLOBIN AND HEMATOCRIT, BLOOD
HEMATOCRIT: 30.4 % — AB (ref 39.0–52.0)
Hemoglobin: 9.8 g/dL — ABNORMAL LOW (ref 13.0–17.0)

## 2015-07-19 MED ORDER — OXYCODONE HCL 5 MG PO TABS: 5.0000 mg | ORAL_TABLET | ORAL | Status: AC | PRN

## 2015-07-19 MED ORDER — OXYCODONE HCL 5 MG PO TABS
5.0000 mg | ORAL_TABLET | ORAL | Status: DC | PRN
  Administered 2015-07-19 – 2015-07-20 (×8): 10 mg via ORAL
  Filled 2015-07-19 (×8): qty 2

## 2015-07-19 NOTE — Care Management (Signed)
Utilization review completed. Teja Costen, RN Case Manager [phone]. 

## 2015-07-19 NOTE — Progress Notes (Signed)
OT Cancellation Note  Patient Details Name: [name] MRN: [id number] DOB: [birth year]   Cancelled Treatment:    Reason Eval/Treat Not Completed: Pain limiting ability to participate (9/10). Pt declined therapy this afternoon due to severe pain and pt reported "I don't feel good about going home today. I think tomorrow would be better." Will attempt to see pt tomorrow to perform exercises and dressing tasks.  Nils PyleJulia Denielle Bayard, OTR/L Pager: [phone] 07/19/2015, 2:57 PM

## 2015-07-19 NOTE — Progress Notes (Signed)
Occupational Therapy Evaluation Patient Details Name: [name] MRN: [id number] DOB: 04/27/1961 Today's Date: 07/19/2015    History of Present Illness 55 y.o. male s/p R total shoulder arthroplasty.   Clinical Impression   PTA, pt was independent with ADLs and mobility. Pt currently requires min assist for UB ADLs and min guard assist for mobility. Began education on shoulder precautions, sling wear protocol, positioning for sleep, and compensatory strategies for bathing and dressing. Pt plans to d/c home with 24/7 assistance from his wife until Wednesday. Pt will benefit from continued acute OT to increase independence and safety with ADLs and mobility to allow for safe discharge home.     Follow Up Recommendations  Outpatient OT (Progress shoulder rehab at MD discretion)    Equipment Recommendations  None recommended by OT    Recommendations for Other Services       Precautions / Restrictions Precautions Precautions: Shoulder Type of Shoulder Precautions: Active protocol Shoulder Interventions: Shoulder sling/immobilizer;Off for dressing/bathing/exercises Precaution Booklet Issued: Yes (comment) Precaution Comments: A/PROM shoulder FF 90, ABD 60, ER 30; AROM elbow/wrist/hand Required Braces or Orthoses: Sling Restrictions Weight Bearing Restrictions: Yes RUE Weight Bearing: Non weight bearing      Mobility Bed Mobility Overal bed mobility: Needs Assistance Bed Mobility: Supine to Sit     Supine to sit: Min guard;HOB elevated     General bed mobility comments: HOB elevated, no use of bedrails to simulate home environment. Min guard assist for safety - no physical assist required  Transfers Overall transfer level: Needs assistance Equipment used: None Transfers: Sit to/from Stand Sit to Stand: Supervision         General transfer comment: Supervision for safety. No physical assist required, no overt LOB and reports of minimal dizziness from pain  medication    Balance Overall balance assessment: Needs assistance Sitting-balance support: No upper extremity supported;Feet supported Sitting balance-Leahy Scale: Good     Standing balance support: No upper extremity supported;During functional activity Standing balance-Leahy Scale: Good                              ADL Overall ADL's : Needs assistance/impaired     Grooming: Wash/dry hands;Supervision/safety;Standing                   Toilet Transfer: Supervision/safety;Ambulation   Toileting- Clothing Manipulation and Hygiene: Supervision/safety;Sit to/from stand       Functional mobility during ADLs: Supervision/safety General ADL Comments: Began education on shoulder precautions, sling wear protocol, and compensatory strategies for UB bathing and dressing     Vision Vision Assessment?: No apparent visual deficits   Perception     Praxis      Pertinent Vitals/Pain Pain Assessment: 0-10 Pain Score: 4  Pain Location: R shoulder Pain Descriptors / Indicators: Aching;Sore Pain Intervention(s): Limited activity within patient's tolerance;Monitored during session;Repositioned     Hand Dominance Right   Extremity/Trunk Assessment Upper Extremity Assessment Upper Extremity Assessment: RUE deficits/detail RUE Deficits / Details: decreased ROM and strength as expected post op RUE: Unable to fully assess due to immobilization;Unable to fully assess due to pain   Lower Extremity Assessment Lower Extremity Assessment: Overall WFL for tasks assessed   Cervical / Trunk Assessment Cervical / Trunk Assessment: Normal   Communication Communication Communication: No difficulties   Cognition Arousal/Alertness: Awake/alert Behavior During Therapy: WFL for tasks assessed/performed Overall Cognitive Status: Within Functional Limits for tasks assessed  General Comments       Exercises Exercises: Shoulder     Shoulder  Instructions Shoulder Instructions Donning/doffing shirt without moving shoulder: Minimal assistance Method for sponge bathing under operated UE: Minimal assistance Donning/doffing sling/immobilizer: Minimal assistance Correct positioning of sling/immobilizer: Min-guard Sling wearing schedule (on at all times/off for ADL's): Min-guard Proper positioning of operated UE when showering: Min-guard Positioning of UE while sleeping: Min-guard    Home Living Family/patient expects to be discharged to:: Private residence Living Arrangements: Spouse/significant other Available Help at Discharge: Family;Available 24 hours/day (Until Wednesday) Type of Home: House Home Access: Stairs to enter Entergy CorporationEntrance Stairs-Number of Steps: 3 Entrance Stairs-Rails: None Home Layout: One level     Bathroom Shower/Tub: Walk-in shower;Door   Foot LockerBathroom Toilet: Handicapped height     Home Equipment: Hand [name] shower head;Other (comment) (adjustable bed)          Prior Functioning/Environment Level of Independence: Independent        Comments: Works full-time and highway drives daily for work    OT Diagnosis: Acute pain   OT Problem List: Decreased strength;Decreased range of motion;Decreased activity tolerance;Impaired balance (sitting and/or standing);Decreased coordination;Decreased safety awareness;Decreased knowledge of use of DME or AE;Decreased knowledge of precautions;Pain   OT Treatment/Interventions: Self-care/ADL training;Therapeutic exercise;Energy conservation;DME and/or AE instruction;Therapeutic activities;Patient/family education;Balance training    OT Goals(Current goals can be found in the care plan section) Acute Rehab OT Goals Patient Stated Goal: to go home OT Goal Formulation: With patient Time For Goal Achievement: 08/02/15 Potential to Achieve Goals: Good ADL Goals Pt Will Perform Upper Body Bathing: with min guard assist;with caregiver independent in assisting;sitting Pt  Will Perform Upper Body Dressing: with min guard assist;with caregiver independent in assisting;sitting Pt Will Perform Lower Body Dressing: with min guard assist;with caregiver independent in assisting;sit to/from stand Pt/caregiver will Perform Home Exercise Program: Right Upper extremity;Increased ROM;With written HEP provided;With Supervision  OT Frequency: Min 2X/week   Barriers to D/C:            Co-evaluation              End of Session Equipment Utilized During Treatment: Gait belt;Rolling walker Nurse Communication: Mobility status;Other (comment) (Need 2nd session before discharge)  Activity Tolerance: Patient tolerated treatment well Patient left: in chair;with call bell/phone within reach;with family/visitor present   Time: [id number] OT Time Calculation (min): 20 min Charges:  OT General Charges $OT Visit: 1 Procedure OT Evaluation $OT Eval Moderate Complexity: 1 Procedure G-Codes:    Nils PyleJulia Dalen [place], OTR/L Pager: [phone]: [phone] 07/19/2015, 1:00 PM

## 2015-07-19 NOTE — Progress Notes (Signed)
Subjective: 1 Day Post-Op Procedure(s) (LRB): RIGHT TOTAL SHOULDER ARTHROPLASTY (Right) Patient reports pain as moderate to severe. Norco not controlling pain. Pain is worse this morning than yesterday. No numbness or tingling. No other c/o. Denies CP, SOB, N/V.    Objective: Vital signs in last 24 hours: Temp:  [97.8 F (36.6 C)-98.7 F (37.1 C)] 98.2 F (36.8 C) (03/04 0350) Pulse Rate:  [64-83] 71 (03/04 0350) Resp:  [12-24] 18 (03/04 0350) BP: (102-126)/(63-83) 126/77 mmHg (03/04 0350) SpO2:  [95 %-100 %] 95 % (03/04 0350)  Intake/Output from previous day: 03/03 0701 - 03/04 0700 In: 1920 [P.O.:270; I.V.:1650] Out: 200 [Blood:200] Intake/Output this shift:     Recent Labs  07/19/15 0530  HGB 9.8*    Recent Labs  07/19/15 0530  HCT 30.4*    Recent Labs  07/19/15 0530  NA 135  K 3.8  CL 103  CO2 25  BUN 15  CREATININE 0.72  GLUCOSE 117*  CALCIUM 8.7*   No results for input(s): LABPT, INR in the last 72 hours.  Neurologically intact ABD soft Neurovascular intact Sensation intact distally Intact pulses distally Dorsiflexion/Plantar flexion intact Incision: dressing C/D/I and no drainage No cellulitis present Compartment soft no sign of DVT  Assessment/Plan: 1 Day Post-Op Procedure(s) (LRB): RIGHT TOTAL SHOULDER ARTHROPLASTY (Right) Advance diet Up with therapy D/C IV fluids  Switch from Norco to OxyIR for pain control here and at home Possible D/C later today if pain controlled and tolerates PT well Please page or call my cell at [phone] if pt desires to go home today and will place D/C order otherwise plan to D/C tomorrow  BISSELL, Lockie ParesJACLYN M. 07/19/2015, 11:08 AM

## 2015-07-20 NOTE — Progress Notes (Signed)
Occupational Therapy Treatment/Discharge Patient Details Name: [name] MRN: [id number] DOB: 11/25/1960 Today's Date: 07/20/2015    History of present illness 55 y.o. male s/p R total shoulder arthroplasty.   OT comments  Pt completed RUE exercise program with no physical assist and min instruction cues. Reviewed shoulder precautions and practiced UB dressing tasks and donning/doffing sling. Educated pt on compensatory strategies for ADLs, pain/edema management, energy conservation, gradually increasing activity level, and fall prevention strategies. All education has been completed and pt/wife and have no further questions. Pt with no further acute OT needs. OT signing off.    Follow Up Recommendations  Outpatient OT (Progress shoulder rehab at MD discretion)    Equipment Recommendations  None recommended by OT    Recommendations for Other Services      Precautions / Restrictions Precautions Precautions: Shoulder Type of Shoulder Precautions: Active protocol Shoulder Interventions: Shoulder sling/immobilizer;Off for dressing/bathing/exercises Precaution Booklet Issued: Yes (comment) Precaution Comments: A/PROM shoulder FF 90, ABD 60, ER 30; AROM elbow/wrist/hand Required Braces or Orthoses: Sling Restrictions Weight Bearing Restrictions: Yes RUE Weight Bearing: Non weight bearing       Mobility Bed Mobility               General bed mobility comments: Pt up ambulating in hallway with wife  Transfers Overall transfer level: Modified independent Equipment used: None                  Balance Overall balance assessment: No apparent balance deficits (not formally assessed)                                 ADL Overall ADL's : Needs assistance/impaired         Upper Body Bathing: Minimal assitance;Cueing for UE precautions;Sitting   Lower Body Bathing: Minimal assistance;With caregiver independent assisting;Sit to/from stand   Upper  Body Dressing : Minimal assistance;Sitting;Cueing for compensatory techniques;Cueing for UE precautions Upper Body Dressing Details (indicate cue type and reason): Cues to dress RUE first and undress it last Lower Body Dressing: Minimal assistance;With caregiver independent assisting;Sit to/from stand;Cueing for compensatory techniques Lower Body Dressing Details (indicate cue type and reason): Cues to dress LB first while RUE still in sling for safety Toilet Transfer: Modified Independent;Ambulation;Regular Toilet   Toileting- Clothing Manipulation and Hygiene: Modified independent;Sit to/from stand         General ADL Comments: Completed education on RUE exercise program, donning/doffing sling and clothing, bathing, pain/edema management, energy conservation, and fall prevention strategies. Emphasized importance of gradually increasing activity level for safety.       Vision                     Perception     Praxis      Cognition   Behavior During Therapy: [facility] for tasks assessed/performed Overall Cognitive Status: Within Functional Limits for tasks assessed                       Extremity/Trunk Assessment               Exercises Shoulder Exercises Shoulder Flexion: PROM;AAROM;Right;10 reps;Seated Shoulder ABduction: PROM;AAROM;Right;10 reps;Seated Shoulder External Rotation: PROM;AAROM;Right;10 reps;Seated Elbow Flexion: AROM;Right;10 reps;Seated Elbow Extension: AROM;Right;10 reps;Seated Wrist Flexion: AROM;Right;10 reps;Seated Wrist Extension: AROM;Right;10 reps;Seated Digit Composite Flexion: AROM;Right;10 reps;Seated Composite Extension: AROM;Right;10 reps;Seated Neck Flexion: AROM;Other reps (comment);Seated (as needed) Neck Extension: AROM;Other reps (comment);Seated (as needed) Neck  Lateral Flexion - Right: AROM;Other reps (comment);Seated (as needed) Neck Lateral Flexion - Left: AROM;10 reps;Other reps (comment);Seated (as  needed) Donning/doffing shirt without moving shoulder: Minimal assistance Method for sponge bathing under operated UE: Minimal assistance Donning/doffing sling/immobilizer: Minimal assistance Correct positioning of sling/immobilizer: Independent ROM for elbow, wrist and digits of operated UE: Independent Sling wearing schedule (on at all times/off for ADL's): Independent Proper positioning of operated UE when showering: Independent Positioning of UE while sleeping: Independent   Shoulder Instructions Shoulder Instructions Donning/doffing shirt without moving shoulder: Minimal assistance Method for sponge bathing under operated UE: Minimal assistance Donning/doffing sling/immobilizer: Minimal assistance Correct positioning of sling/immobilizer: Independent ROM for elbow, wrist and digits of operated UE: Independent Sling wearing schedule (on at all times/off for ADL's): Independent Proper positioning of operated UE when showering: Independent Positioning of UE while sleeping: Independent     General Comments      Pertinent Vitals/ Pain       Pain Assessment: 0-10 Pain Score: 4  Pain Location: R shoulder Pain Descriptors / Indicators: Sore Pain Intervention(s): Limited activity within patient's tolerance;Premedicated before session;Monitored during session;Repositioned;Ice applied  Home Living                                          Prior Functioning/Environment              Frequency       Progress Toward Goals  OT Goals(current goals can now be found in the care plan section)  Progress towards OT goals: Goals met/education completed, patient discharged from OT  Acute Rehab OT Goals Patient Stated Goal: to get back to work OT Goal Formulation: With patient Time For Goal Achievement: 08/02/15 Potential to Achieve Goals: Good ADL Goals Pt Will Perform Upper Body Bathing: with min guard assist;with caregiver independent in assisting;sitting Pt  Will Perform Upper Body Dressing: with min guard assist;with caregiver independent in assisting;sitting Pt Will Perform Lower Body Dressing: with min guard assist;with caregiver independent in assisting;sit to/from stand Pt/caregiver will Perform Home Exercise Program: Right Upper extremity;Increased ROM;With written HEP provided;With Supervision  Plan All goals met and education completed, patient discharged from OT services    Co-evaluation                 End of Session Equipment Utilized During Treatment: Other (comment) (sling)   Activity Tolerance Patient tolerated treatment well   Patient Left in chair;with call bell/phone within reach;with family/visitor present   Nurse Communication Mobility status;Other (comment) (pt requesting dressing change before discharge)        Time: 6720-9470 OT Time Calculation (min): 26 min  Charges: OT General Charges $OT Visit: 1 Procedure OT Treatments $Self Care/Home Management : 23-37 mins  Redmond Baseman, OTR/L Pager: [phone] 07/20/2015, 9:06 AM

## 2015-07-20 NOTE — Progress Notes (Signed)
Subjective: 2 Days Post-Op Procedure(s) (LRB): RIGHT TOTAL SHOULDER ARTHROPLASTY (Right) Patient reports pain as well .  Reports he is ready to go home. Denies SOB, CP, or calf pain.  Objective: Vital signs in last 24 hours: Temp:  [98.3 F (36.8 C)-99.3 F (37.4 C)] 98.3 F (36.8 C) (03/05 0630) Pulse Rate:  [79-89] 79 (03/05 0630) Resp:  [16-18] 16 (03/05 0630) BP: (121-132)/(72-82) 121/72 mmHg (03/05 0630) SpO2:  [96 %-97 %] 96 % (03/05 0630)  Intake/Output from previous day: 03/04 0701 - 03/05 0700 In: 360 [P.O.:360] Out: -  Intake/Output this shift:     Recent Labs  07/19/15 0530  HGB 9.8*    Recent Labs  07/19/15 0530  HCT 30.4*    Recent Labs  07/19/15 0530  NA 135  K 3.8  CL 103  CO2 25  BUN 15  CREATININE 0.72  GLUCOSE 117*  CALCIUM 8.7*   No results for input(s): LABPT, INR in the last 72 hours.  Well nourished. Alert and oriented x3. RRR, Lungs clear, BS x4. Abdomen soft and non tender. Right Calf soft and non tender. Right knee dressing C/D/I. No DVT signs. Compartment soft. No signs of infection.  Right UE grossly neurovascular intact.  Assessment/Plan: 2 Days Post-Op Procedure(s) (LRB): RIGHT TOTAL SHOULDER ARTHROPLASTY (Right) D/C home with family Follow instructions F/u with Dr. Ranell PatrickNorris Change dressing Up with PT  Arsenio LoaderSTILWELL, Helio Lack L 07/20/2015, 10:27 AM

## 2015-07-21 ENCOUNTER — Encounter (HOSPITAL_COMMUNITY): Payer: Self-pay | Admitting: Orthopedic Surgery

## 2015-07-23 NOTE — Discharge Summary (Signed)
Physician Discharge Summary  Patient ID: [name] MRN: [id number] DOB/AGE: [birth year] 55 y.o.  Admit date: 07/18/2015 Discharge date: 07/23/2015  Admission Diagnoses: Shoulder OA  Discharge Diagnoses:  Active Problems:   S/P shoulder replacement   Discharged Condition: good  Hospital Course:  [name] is a 55 y.o. who was admitted to [facility]. They were brought to the operating room on 07/18/2015 and underwent Procedure(s): RIGHT TOTAL SHOULDER ARTHROPLASTY.  Patient tolerated the procedure well and was later transferred to the recovery room and then to the orthopaedic floor for postoperative care.  They were given PO and IV analgesics for pain control following their surgery.  They were given 24 hours of postoperative antibiotics of  Anti-infectives    Start     Dose/Rate Route Frequency Ordered Stop   07/18/15 1700  ceFAZolin (ANCEF) IVPB 2 g/50 mL premix     2 g 100 mL/hr over 30 Minutes Intravenous Every 6 hours 07/18/15 1617 07/19/15 0533   07/18/15 0700  ceFAZolin (ANCEF) IVPB 2 g/50 mL premix     2 g 100 mL/hr over 30 Minutes Intravenous To ShortStay Surgical 07/17/15 1103 07/18/15 1018       PT and OT were ordered for total joint protocol.  Discharge planning consulted to help with postop disposition and equipment needs.  Patient had a good night on the evening of surgery and started to get up OOB with therapy on day one.  Continued to work with therapy into day two.  Dressing was with normal limits.  The patient had progressed with therapy and meeting their goals. Patient was seen in rounds and was ready to go home.  Consults: n/a  Significant Diagnostic Studies: routine  Treatments: routine  Discharge Exam: Blood pressure 121/72, pulse 79, temperature 98.3 F (36.8 C), temperature source Oral, resp. rate 16, weight 86.274 kg (190 lb 3.2 oz), SpO2 96 %. Alert and oriented x3. RRR, Lungs clear, BS x4.  Calf soft and non tender. L shoulder dressing  changed. No DVT signs. No signs of infection or compartment syndrome. LUE grossly neurovascularly intact.   Disposition: 01-Home or Self Care     Medication List    STOP taking these medications        diphenhydrAMINE 25 MG tablet  Commonly known as:  SOMINEX      TAKE these medications        acetaminophen 650 MG CR tablet  Commonly known as:  TYLENOL  Take 1,300 mg by mouth daily as needed for pain.     AMBIEN CR 12.5 MG CR tablet  Generic drug:  zolpidem  Take 12.5 mg by mouth at bedtime as needed for sleep.     BC FAST PAIN RELIEF ARTHRITIS PO  Take 1 Package by mouth daily as needed (for pain).     busPIRone 7.5 MG tablet  Commonly known as:  BUSPAR  Take 7.5 mg by mouth 2 (two) times daily as needed.     FLONASE 50 MCG/ACT nasal spray  Generic drug:  fluticasone  Place 1 spray into the nose daily as needed for allergies.     FLUoxetine 20 MG capsule  Commonly known as:  PROZAC  Take 20 mg by mouth daily.     LIPITOR 10 MG tablet  Generic drug:  atorvastatin  Take 10 mg by mouth at bedtime.     methocarbamol 500 MG tablet  Commonly known as:  ROBAXIN  Take 1 tablet (500 mg total) by mouth  3 (three) times daily as needed.     oxyCODONE 5 MG immediate release tablet  Commonly known as:  Oxy IR/ROXICODONE  Take 1-2 tablets (5-10 mg total) by mouth every 3 (three) hours as needed for severe pain.     ZESTORETIC 10-12.5 MG tablet  Generic drug:  lisinopril-hydrochlorothiazide  Take 1 tablet by mouth daily.           Follow-up Information    Follow up with NORRIS,Newell R, MD. Call in 2 weeks.   Specialty:  Orthopedic Surgery   Why:  [phone]   Contact information:   [address] [place] [address] [phone]       Signed: Markham JordanSTILWELL, Damiah Mcdonald L 07/23/2015, 11:43 AM

## 2015-07-28 NOTE — Anesthesia Postprocedure Evaluation (Signed)
Anesthesia Post Note  Patient: [name]  Procedure(s) Performed: Procedure(s) (LRB): RIGHT TOTAL SHOULDER ARTHROPLASTY (Right)  Anesthetic complications: no Comments: Pt discharged to home prior to post-op note, no reported anesthetic complications    Last Vitals:  Filed Vitals:   07/19/15 2152 07/20/15 0630  BP: 132/82 121/72  Pulse: 89 79  Temp: 37.4 C 36.8 C  Resp: 18 16    Last Pain:  Filed Vitals:   07/20/15 1332  PainSc: 3                  Americo Vallery,E. Ark Agrusa

## 2016-12-18 IMAGING — CT CT SHOULDER*R* W/O CM
1 series · 12 of 14 positions shown, 15 images · non-contrast
Comparison: None.

CLINICAL DATA: Chronic right shoulder pain with numbness in the
fingers for 6 months

EXAM:
CT OF THE RIGHT SHOULDER WITHOUT CONTRAST
TECHNIQUE: Multidetector CT imaging was performed according to the standard
protocol. Multiplanar CT image reconstructions were also generated.

[Series 2: soft tissue · axial · 0.49mm/px · z∈[-205,-43]mm · 12 of 64 slices shown, 15 images]
[im 5/64  soft-tissue]
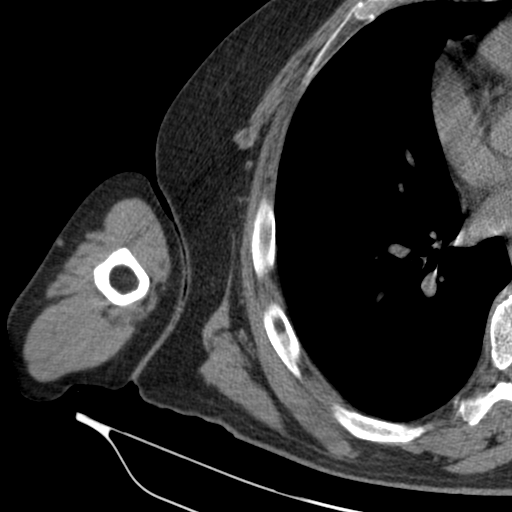
[im 5/64  bone]
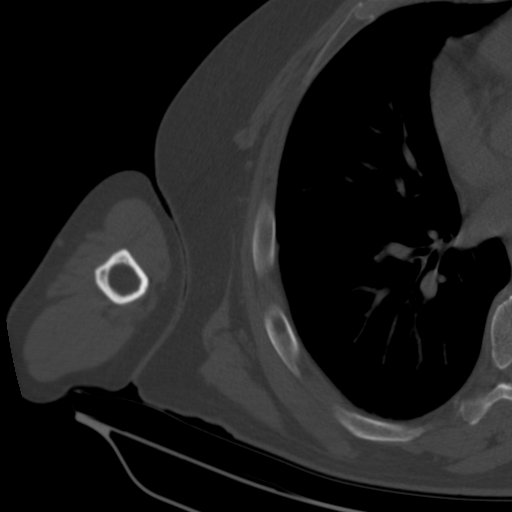
[im 10/64  bone]
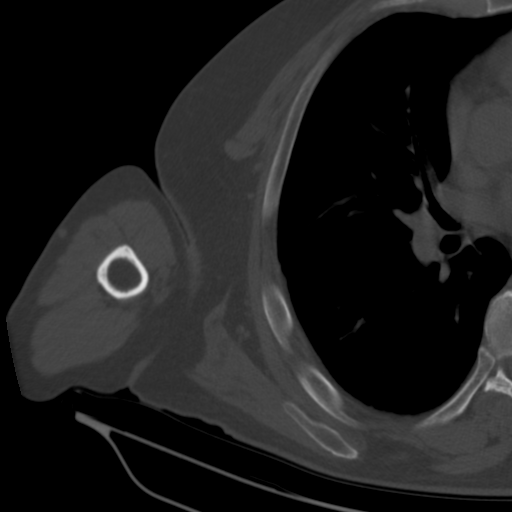
[im 15/64  bone]
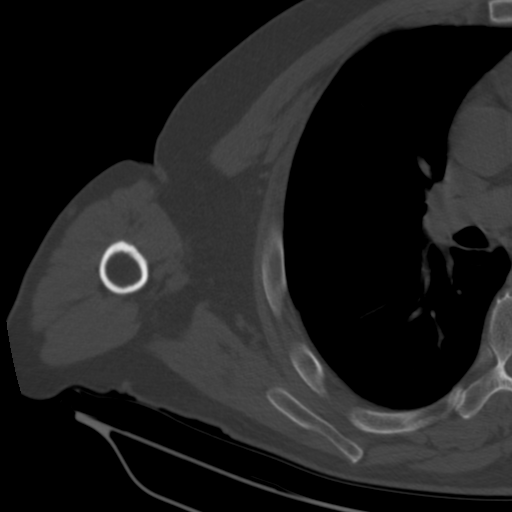
[im 20/64  bone]
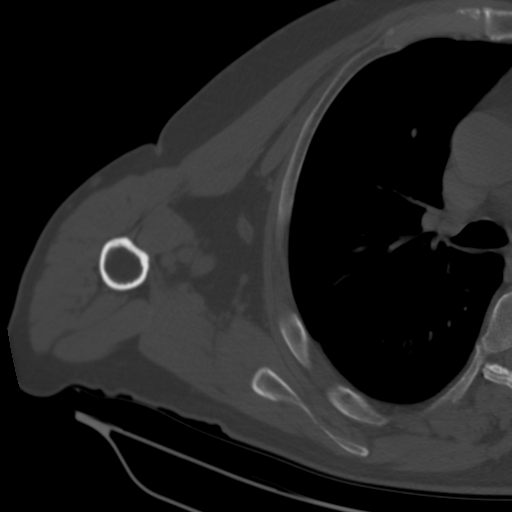
[im 25/64  soft-tissue]
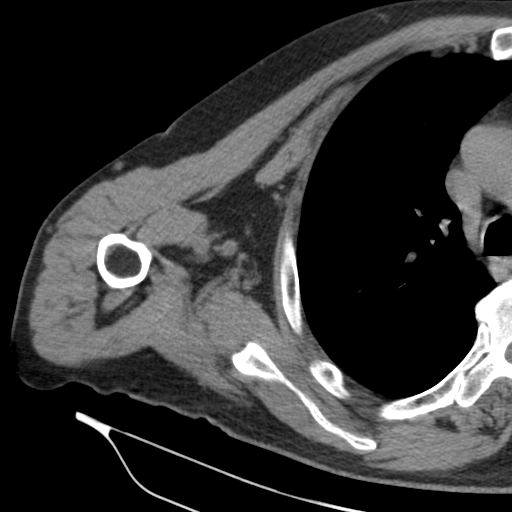
[im 25/64  bone]
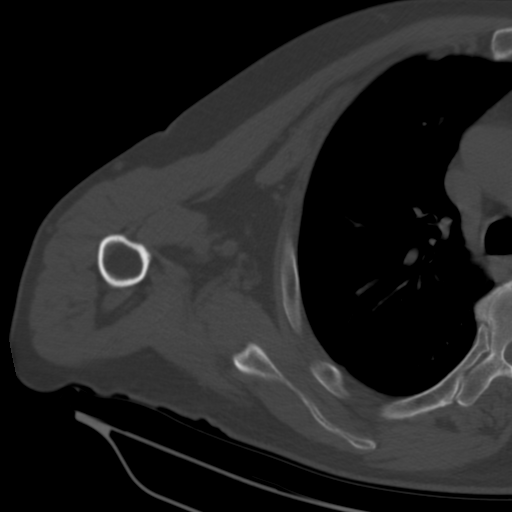
[im 30/64  bone]
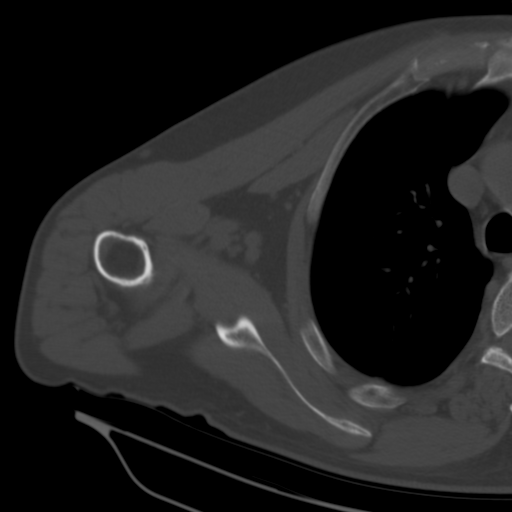
[im 34/64  bone]
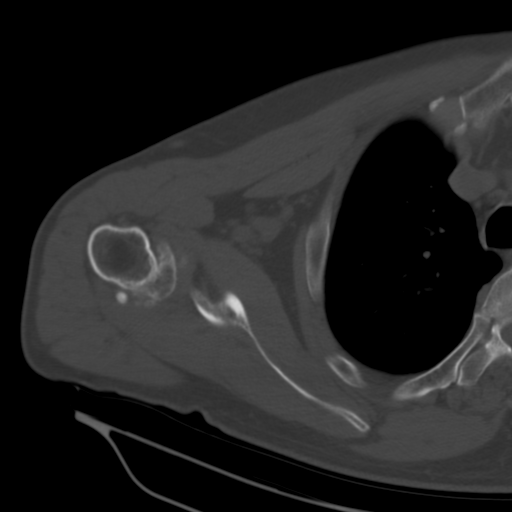
[im 39/64  bone]
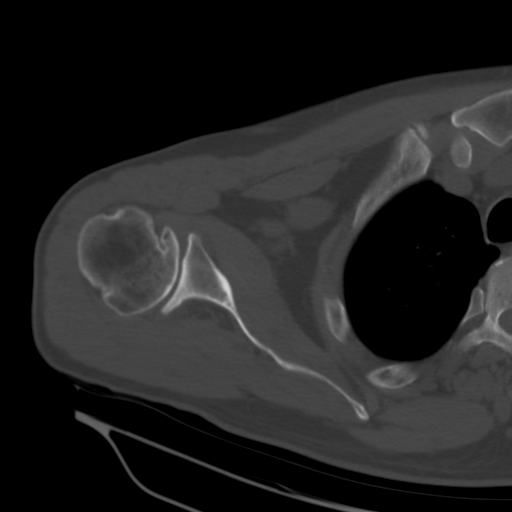
[im 44/64  soft-tissue]
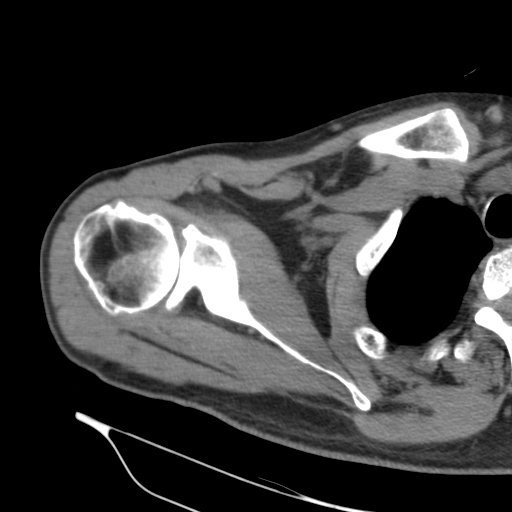
[im 44/64  bone]
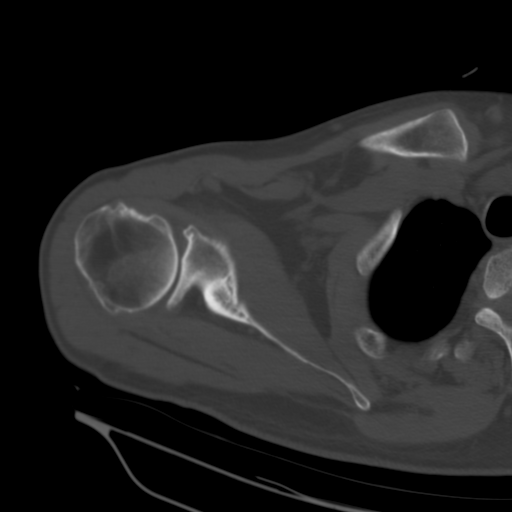
[im 49/64  bone]
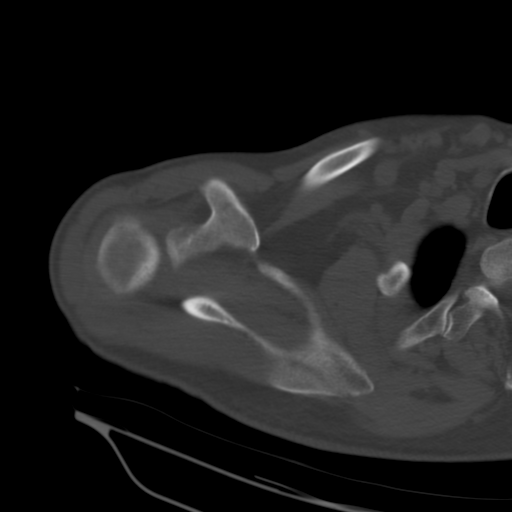
[im 54/64  bone]
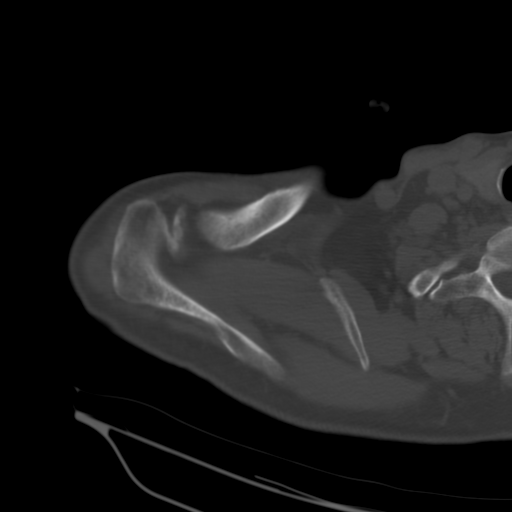
[im 59/64  bone]
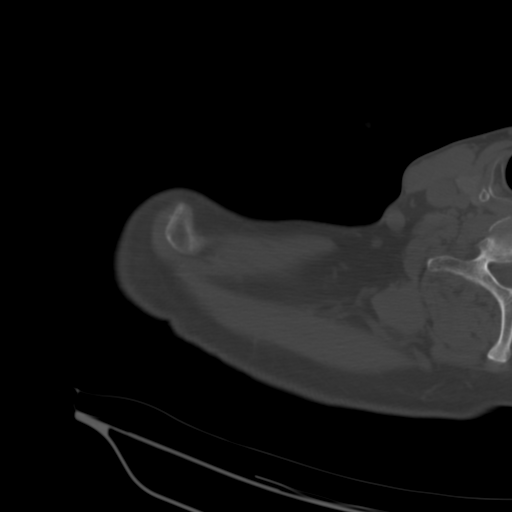

[12 of 14 positions shown; findings below may reference images not displayed]

FINDINGS: There is no acute fracture or dislocation. There is no lytic or
sclerotic osseous lesion. There is severe osteoarthritis of the
right glenohumeral joint. There are mild degenerative changes of the
acromioclavicular joint. There is a type I acromion.

There is no fluid collection or hematoma. The muscles are normal.
There is no muscle atrophy. There is no axillary lymphadenopathy.
The visualized right lung is clear.
IMPRESSION: 1. Severe osteoarthritis of the right glenohumeral joint.

## 2019-07-20 ENCOUNTER — Ambulatory Visit: Payer: Self-pay | Attending: Internal Medicine

## 2019-07-20 DIAGNOSIS — Z23 Encounter for immunization: Secondary | ICD-10-CM | POA: Insufficient documentation

## 2019-07-20 NOTE — Progress Notes (Signed)
   Covid-19 Vaccination Clinic  Name:  GUISEPPE FLANAGAN    MRN: [id number] DOB: [birth year]  07/20/2019  Mr. Montelongo was observed post Covid-19 immunization for 15 minutes without incident. He was provided with Vaccine Information Sheet and instruction to access the V-Safe system.   Mr. Ricardo was instructed to call 911 with any severe reactions post vaccine: Marland Kitchen Difficulty breathing  . Swelling of face and throat  . A fast heartbeat  . A bad rash all over body  . Dizziness and weakness   Immunizations Administered    Name Date Dose VIS Date Route   Pfizer COVID-19 Vaccine 07/20/2019 10:00 AM 0.3 mL 04/27/2019 Intramuscular   Manufacturer: [employer], [employer]   Lot: [id number]   NDC: [address]-1

## 2019-08-15 ENCOUNTER — Ambulatory Visit: Payer: Self-pay | Attending: Internal Medicine

## 2019-08-15 DIAGNOSIS — Z23 Encounter for immunization: Secondary | ICD-10-CM

## 2019-08-15 NOTE — Progress Notes (Signed)
   Covid-19 Vaccination Clinic  Name:  [name]    MRN: [id number] DOB: Sep 24, 1960  08/15/2019  [name] was observed post Covid-19 immunization for 15 minutes without incident. He was provided with Vaccine Information Sheet and instruction to access the V-Safe system.   [name] was instructed to call 911 with any severe reactions post vaccine: Marland Kitchen Difficulty breathing  . Swelling of face and throat  . A fast heartbeat  . A bad rash all over body  . Dizziness and weakness   Immunizations Administered    Name Date Dose VIS Date Route   Pfizer COVID-19 Vaccine 08/15/2019  9:25 AM 0.3 mL 04/27/2019 Intramuscular   Manufacturer: [employer], [employer]   Lot: [id number]   NDC: [address]-1

## 2020-12-04 ENCOUNTER — Telehealth: Payer: Self-pay | Admitting: *Deleted

## 2021-01-12 NOTE — Telephone Encounter (Signed)
error
